# Patient Record
Sex: Female | Born: 1965 | Race: White | Hispanic: No | Smoking: Current every day smoker
Health system: Southern US, Community
[De-identification: ages and names within clinical notes are randomized; demographics above are authoritative.]

## PROBLEM LIST (undated history)

## (undated) DIAGNOSIS — J449 Chronic obstructive pulmonary disease, unspecified: Secondary | ICD-10-CM

## (undated) DIAGNOSIS — F419 Anxiety disorder, unspecified: Secondary | ICD-10-CM

## (undated) DIAGNOSIS — F17200 Nicotine dependence, unspecified, uncomplicated: Secondary | ICD-10-CM

## (undated) DIAGNOSIS — F32A Depression, unspecified: Secondary | ICD-10-CM

## (undated) DIAGNOSIS — J45909 Unspecified asthma, uncomplicated: Secondary | ICD-10-CM

## (undated) HISTORY — PX: ABLATION: SHX5711

## (undated) HISTORY — PX: TIBIA FRACTURE SURGERY: SHX806

---

## 2020-02-20 ENCOUNTER — Emergency Department (INDEPENDENT_AMBULATORY_CARE_PROVIDER_SITE_OTHER)
Admission: EM | Admit: 2020-02-20 | Discharge: 2020-02-20 | Disposition: A | Discharge status: Home / Self Care | Payer: BC Managed Care – PPO | Source: Home / Self Care

## 2020-02-20 ENCOUNTER — Emergency Department (INDEPENDENT_AMBULATORY_CARE_PROVIDER_SITE_OTHER): Payer: BC Managed Care – PPO

## 2020-02-20 ENCOUNTER — Other Ambulatory Visit: Payer: Self-pay

## 2020-02-20 DIAGNOSIS — S42201A Unspecified fracture of upper end of right humerus, initial encounter for closed fracture: Secondary | ICD-10-CM | POA: Diagnosis not present

## 2020-02-20 DIAGNOSIS — W010XXA Fall on same level from slipping, tripping and stumbling without subsequent striking against object, initial encounter: Secondary | ICD-10-CM

## 2020-02-20 DIAGNOSIS — S40021A Contusion of right upper arm, initial encounter: Secondary | ICD-10-CM

## 2020-02-20 DIAGNOSIS — M7989 Other specified soft tissue disorders: Secondary | ICD-10-CM | POA: Diagnosis not present

## 2020-02-20 DIAGNOSIS — W1830XA Fall on same level, unspecified, initial encounter: Secondary | ICD-10-CM

## 2020-02-20 DIAGNOSIS — M79621 Pain in right upper arm: Secondary | ICD-10-CM

## 2020-02-20 MED ORDER — IBUPROFEN 800 MG PO TABS: 800.0000 mg | ORAL_TABLET | Freq: Three times a day (TID) | ORAL | 0 refills | Status: DC | PRN

## 2020-02-20 MED ORDER — FAMOTIDINE 20 MG PO TABS: 20.0000 mg | ORAL_TABLET | Freq: Two times a day (BID) | ORAL | 0 refills | Status: DC

## 2020-02-20 MED ORDER — HYDROCODONE-ACETAMINOPHEN 5-325 MG PO TABS
ORAL_TABLET | ORAL | 0 refills | Status: DC
Start: 2020-02-20 — End: 2020-02-26

## 2020-02-20 NOTE — ED Provider Notes (Addendum)
[name]    CSN: [id number] Arrival date & time: 02/20/20  8453      History   Chief Complaint Chief Complaint  Patient presents with  . Arm Pain    RT    HPI Janet Harper is a 54 y.o. female.   HPI Janet Harper is a 54 y.o. female presenting to UC with c/o sudden onset Right upper arm pain that started yesterday at [facility], pt tripped on a towel in her bathroom, caught her Right upper arm on the counter top.  She has tried rest, ice, and ibuprofen but pain and bruising has only worsened. Pain kept her up last night.  Pain is 3/10 at rest, severe with movement.  Last dose of ibuprofen 800mg  at 7AM this morning. She is Right hand dominant.    History reviewed. No pertinent past medical history.  There are no problems to display for this patient.   History reviewed. No pertinent surgical history.  OB History   No obstetric history on file.      Home Medications    Prior to Admission medications   Medication Sig Start Date End Date Taking? Authorizing Provider  lamoTRIgine (LAMICTAL) 100 MG tablet TAKE 1 TABLET BY MOUTH QD(IF OUT OF LAMICTAL MORE THAN 1 WEEK DO NOT RESTART, CALL MD) 02/07/16  Yes [provider]  cetirizine-pseudoephedrine (ZYRTEC-D) 5-120 MG tablet Take 1 tablet by mouth daily.    [provider]  famotidine (PEPCID) 20 MG tablet Take 1 tablet (20 mg total) by mouth 2 (two) times daily. 02/20/20   02/22/20, PA-C  HYDROcodone-acetaminophen (NORCO/VICODIN) 5-325 MG tablet Take 1 tab every 4-6 hours for moderate to severe pain 02/20/20   02/22/20, PA-C  ibuprofen (ADVIL) 800 MG tablet Take 1 tablet (800 mg total) by mouth 3 (three) times daily as needed for moderate pain. 02/20/20   02/22/20, PA-C  sertraline (ZOLOFT) 50 MG tablet Take 50 mg by mouth daily. 01/09/20   [provider]    Family History History reviewed. No pertinent family history.  Social History Social History   Tobacco Use  .  Smoking status: Current Every Day Smoker    Packs/day: 0.50  . Smokeless tobacco: Never Used  Vaping Use  . Vaping Use: Never used  Substance Use Topics  . Alcohol use: Yes    Comment: occ  . Drug use: Not on file     Allergies   Prednisone   Review of Systems Review of Systems  Musculoskeletal: Positive for myalgias. Negative for arthralgias and joint swelling.  Skin: Positive for color change. Negative for wound.  Neurological: Positive for numbness.     Physical Exam Triage Vital Signs ED Triage Vitals  Enc Vitals Group     BP 02/20/20 1009 (!) 170/90     Pulse Rate 02/20/20 1009 (!) 115     Resp 02/20/20 1009 18     Temp 02/20/20 1009 97.9 F (36.6 C)     Temp Source 02/20/20 1009 Oral     SpO2 02/20/20 1009 98 %     Weight --      Height --      Head Circumference --      Peak Flow --      Pain Score 02/20/20 1012 3     Pain Loc --      Pain Edu? --      Excl. in GC? --    No data found.  Updated  Vital Signs BP (!) 170/90 (BP Location: Left Arm)   Pulse (!) 115   Temp 97.9 F (36.6 C) (Oral)   Resp 18   SpO2 98%   Visual Acuity Right Eye Distance:   Left Eye Distance:   Bilateral Distance:    Right Eye Near:   Left Eye Near:    Bilateral Near:     Physical Exam Vitals and nursing note reviewed.  Constitutional:      Appearance: Normal appearance. She is well-developed.  HENT:     Head: Normocephalic and atraumatic.  Cardiovascular:     Rate and Rhythm: Regular rhythm. Tachycardia present.     Pulses:          Radial pulses are 2+ on the right side.  Pulmonary:     Effort: Pulmonary effort is normal.  Musculoskeletal:        General: Tenderness present.     Right upper arm: Tenderness present.       Arms:     Cervical back: Normal range of motion.     Comments: Right shoulder: limited ROM due to severe pain. No obvious deformity. Right elbow: non-tender. Full ROM, pain radiating into upper arm. Right wrist and hand: full ROM, 5/5  grip strength  Skin:    General: Skin is warm and dry.     Capillary Refill: Capillary refill takes less than 2 seconds.  Neurological:     Mental Status: She is alert and oriented to person, place, and time.     Sensory: No sensory deficit.  Psychiatric:        Behavior: Behavior normal.      UC Treatments / Results  Labs (all labs ordered are listed, but only abnormal results are displayed) Labs Reviewed - No data to display  EKG   Radiology DG Humerus Right  Result Date: 02/20/2020 CLINICAL DATA:  Right upper arm pain with swelling and bruising. Fall yesterday EXAM: RIGHT HUMERUS - 2+ VIEW COMPARISON:  None. FINDINGS: Surgical neck humerus fracture which is transverse and mildly impacted. The greater tuberosity is involved without detectable displacement. Osteopenic appearance. Soft tissue swelling. IMPRESSION: Proximal humerus fracture involving the neck and greater tuberosity with mild neck impaction. Electronically Signed   By: Marnee Spring M.D.   On: 02/20/2020 10:53    Procedures Procedures (including critical care time)  Medications Ordered in UC Medications - No data to display  Initial Impression / Assessment and Plan / UC Course  I have reviewed the triage vital signs and the nursing notes.  Pertinent labs & imaging results that were available during my care of the patient were reviewed by me and considered in my medical decision making (see chart for details).     Discussed imaging with pt Reviewed imaging with Dr. Benjamin Stain Will place pt in cuff-collar. Discussed pain medication, pt hesitant to take vicodin, has caused constipation and nausea in the past. Agreeable to have a small amount sent in for severe pain along with 800mg  ibuprofen (pt has already been taking OTC at home) Discussed risks of GI bleed, will start on pepcid   F/u Sports Medicine next week for ongoing care of fracture AVS given  Final Clinical Impressions(s) / UC Diagnoses    Final diagnoses:  Closed fracture of proximal end of right humerus, initial encounter  Contusion of right upper arm, initial encounter  Fall from slip, trip, or stumble, initial encounter     Discharge Instructions      Norco/Vicodin (hydrocodone-acetaminophen) is a  narcotic pain medication, do not combine these medications with others containing tylenol. While taking, do not drink alcohol, drive, or perform any other activities that requires focus while taking these medications.   Be sure to stay hydrated while taking the vicodin to help prevent constipation. Take with food. You can also take miralax to help prevent constipation.  There is an increased risk of stomach bleeds with taking your sertriline and ibuprofen and with taking higher doses of ibuprofen, sure to take the ibuprofen with food and take the prescribed pepcid to help prevent stomach ulcers or bleeds.    Call today or tomorrow to schedule follow up with Sports Medicine  Dr. Benjamin Stain (Dr. Karie Schwalbe) next week for ongoing management of your fracture.      ED Prescriptions    Medication Sig Dispense Auth. Provider   HYDROcodone-acetaminophen (NORCO/VICODIN) 5-325 MG tablet Take 1 tab every 4-6 hours for moderate to severe pain 8 tablet Doroteo Glassman, Grizelda Piscopo O, PA-C   ibuprofen (ADVIL) 800 MG tablet Take 1 tablet (800 mg total) by mouth 3 (three) times daily as needed for moderate pain. 21 tablet Doroteo Glassman, Audrey Thull O, PA-C   famotidine (PEPCID) 20 MG tablet  (Status: Discontinued) Take 1 tablet (20 mg total) by mouth 2 (two) times daily. 14 tablet Doroteo Glassman, Kaisyn Reinhold O, PA-C   famotidine (PEPCID) 20 MG tablet Take 1 tablet (20 mg total) by mouth 2 (two) times daily. 14 tablet Lurene Shadow, [place]     I have reviewed the PDMP during this encounter.   Lurene Shadow, PA-C 02/20/20 1307    Lurene Shadow, PA-C 02/20/20 1308

## 2020-02-20 NOTE — ED Triage Notes (Signed)
Pt c/o RT arm pain since 5am yesterday when pt fell in bathroom after tripping over a towel and hitting her arm on bathroom cabinet. Bruising noted. Ice and ibuprofen prn. Last dose 7am, 800mg  ibuprofen. Pain 3/10 if not moving it.

## 2020-02-20 NOTE — Discharge Instructions (Signed)
  Norco/Vicodin (hydrocodone-acetaminophen) is a narcotic pain medication, do not combine these medications with others containing tylenol. While taking, do not drink alcohol, drive, or perform any other activities that requires focus while taking these medications.   Be sure to stay hydrated while taking the vicodin to help prevent constipation. Take with food. You can also take miralax to help prevent constipation.  There is an increased risk of stomach bleeds with taking your sertriline and ibuprofen and with taking higher doses of ibuprofen, sure to take the ibuprofen with food and take the prescribed pepcid to help prevent stomach ulcers or bleeds.    Call today or tomorrow to schedule follow up with Sports Medicine  Dr. Benjamin Stain (Dr. Karie Schwalbe) next week for ongoing management of your fracture.

## 2020-02-26 ENCOUNTER — Encounter: Payer: Self-pay | Admitting: Sports Medicine

## 2020-02-26 ENCOUNTER — Other Ambulatory Visit: Payer: Self-pay

## 2020-02-26 ENCOUNTER — Ambulatory Visit (INDEPENDENT_AMBULATORY_CARE_PROVIDER_SITE_OTHER): Payer: BC Managed Care – PPO | Admitting: Sports Medicine

## 2020-02-26 ENCOUNTER — Ambulatory Visit (INDEPENDENT_AMBULATORY_CARE_PROVIDER_SITE_OTHER): Payer: BC Managed Care – PPO

## 2020-02-26 DIAGNOSIS — S42294A Other nondisplaced fracture of upper end of right humerus, initial encounter for closed fracture: Secondary | ICD-10-CM

## 2020-02-26 DIAGNOSIS — S42201A Unspecified fracture of upper end of right humerus, initial encounter for closed fracture: Secondary | ICD-10-CM | POA: Insufficient documentation

## 2020-02-26 MED ORDER — CALCIUM CARBONATE-VITAMIN D 600-400 MG-UNIT PO TABS: 1.0000 | ORAL_TABLET | Freq: Two times a day (BID) | ORAL | 11 refills | Status: AC

## 2020-02-26 MED ORDER — MELOXICAM 15 MG PO TABS: ORAL_TABLET | ORAL | 3 refills | Status: DC

## 2020-02-26 MED ORDER — TRAMADOL HCL 50 MG PO TABS: 50.0000 mg | ORAL_TABLET | Freq: Three times a day (TID) | ORAL | 0 refills | Status: DC | PRN

## 2020-02-26 MED ORDER — DOCUSATE SODIUM 100 MG PO CAPS: 100.0000 mg | ORAL_CAPSULE | Freq: Three times a day (TID) | ORAL | 3 refills | Status: DC | PRN

## 2020-02-26 NOTE — Progress Notes (Addendum)
    Procedures performed today:    None.  Independent interpretation of notes and tests performed by another provider:   I personally reviewed the x-rays which show an impacted type proximal humeral fracture without angulation, articular surface appears uninvolved.  Brief History, Exam, Impression, and Recommendations:    Fracture of humerus, proximal, right, closed This is a very pleasant 54 year old female, unfortunately she had a fall, seen in urgent care where x-rays showed an impacted fracture of her proximal humerus, no angulation initially. She was appropriately placed in a cuff and collar, unfortunately hydrocodone caused excessive constipation so we will be switching to meloxicam with tramadol for breakthrough pain. She will do a high-fiber diet and some Colace to keep ahead of the constipation.  Calcium and vitamin D supplementation. Repeating x-rays today, return to see me in a month.   X-rays before the next visit as well.  Jona is a smoker and she understands that that will probably delay her healing to at least 3 months, she will try to cut back on smoking and discussed this with her PCP, she can also try Nicorette gum and patches.    ___________________________________________ Ihor Austin. Benjamin Stain, M.D., ABFM., CAQSM. Primary Care and Sports Medicine [facility]  Adjunct Instructor of Family Medicine  [employer]

## 2020-02-26 NOTE — Addendum Note (Signed)
Addended by: Monica Becton on: 02/26/2020 10:46 AM   Modules accepted: Orders

## 2020-02-26 NOTE — Assessment & Plan Note (Addendum)
This is a very pleasant 54 year old female, unfortunately she had a fall, seen in urgent care where x-rays showed an impacted fracture of her proximal humerus, no angulation initially. She was appropriately placed in a cuff and collar, unfortunately hydrocodone caused excessive constipation so we will be switching to meloxicam with tramadol for breakthrough pain. She will do a high-fiber diet and some Colace to keep ahead of the constipation.  Calcium and vitamin D supplementation. Repeating x-rays today, return to see me in a month.   X-rays before the next visit as well.  Janet Harper is a smoker and she understands that that will probably delay her healing to at least 3 months, she will try to cut back on smoking and discussed this with her PCP, she can also try Nicorette gum and patches.

## 2020-03-25 ENCOUNTER — Ambulatory Visit (INDEPENDENT_AMBULATORY_CARE_PROVIDER_SITE_OTHER): Payer: BC Managed Care – PPO

## 2020-03-25 ENCOUNTER — Other Ambulatory Visit: Payer: Self-pay

## 2020-03-25 ENCOUNTER — Ambulatory Visit (INDEPENDENT_AMBULATORY_CARE_PROVIDER_SITE_OTHER): Payer: BC Managed Care – PPO | Admitting: Sports Medicine

## 2020-03-25 DIAGNOSIS — S42294A Other nondisplaced fracture of upper end of right humerus, initial encounter for closed fracture: Secondary | ICD-10-CM | POA: Diagnosis not present

## 2020-03-25 DIAGNOSIS — S42294D Other nondisplaced fracture of upper end of right humerus, subsequent encounter for fracture with routine healing: Secondary | ICD-10-CM

## 2020-03-25 DIAGNOSIS — W01198A Fall on same level from slipping, tripping and stumbling with subsequent striking against other object, initial encounter: Secondary | ICD-10-CM

## 2020-03-25 NOTE — Progress Notes (Signed)
    Procedures performed today:    None.  Independent interpretation of notes and tests performed by another provider:   X-rays personally reviewed and show good alignment, excellent bony callus.  Brief History, Exam, Impression, and Recommendations:    Fracture of humerus, proximal, right, closed We are now approximately 1 month post proximal humeral fracture on the right, Janet Harper has been in a cuff and collar, taking meloxicam, occasional tramadol. Very little tenderness over the fracture today. She is on calcium and vitamin D, constipation has improved. X-rays look good today, she does understand that this can take 2 to 3 months to heal with her smoking. I like to see her back in a month at which point we will probably transition her into PT. X-ray before next visit.    ___________________________________________ Ihor Austin. Benjamin Stain, M.D., ABFM., CAQSM. Primary Care and Sports Medicine [facility]  Adjunct Instructor of Family Medicine  [employer]

## 2020-03-25 NOTE — Assessment & Plan Note (Addendum)
We are now approximately 1 month post proximal humeral fracture on the right, Annabelle has been in a cuff and collar, taking meloxicam, occasional tramadol. Very little tenderness over the fracture today. She is on calcium and vitamin D, constipation has improved. X-rays look good today, she does understand that this can take 2 to 3 months to heal with her smoking. I like to see her back in a month at which point we will probably transition her into PT. X-ray before next visit.

## 2020-03-27 ENCOUNTER — Other Ambulatory Visit: Payer: Self-pay | Admitting: *Deleted

## 2020-03-27 DIAGNOSIS — S42294A Other nondisplaced fracture of upper end of right humerus, initial encounter for closed fracture: Secondary | ICD-10-CM

## 2020-03-27 MED ORDER — TRAMADOL HCL 50 MG PO TABS: 50.0000 mg | ORAL_TABLET | Freq: Three times a day (TID) | ORAL | 0 refills | Status: DC | PRN

## 2020-04-23 ENCOUNTER — Other Ambulatory Visit: Payer: Self-pay

## 2020-04-23 ENCOUNTER — Ambulatory Visit (INDEPENDENT_AMBULATORY_CARE_PROVIDER_SITE_OTHER): Payer: BC Managed Care – PPO

## 2020-04-23 ENCOUNTER — Ambulatory Visit (INDEPENDENT_AMBULATORY_CARE_PROVIDER_SITE_OTHER): Payer: BC Managed Care – PPO | Admitting: Sports Medicine

## 2020-04-23 DIAGNOSIS — S42294A Other nondisplaced fracture of upper end of right humerus, initial encounter for closed fracture: Secondary | ICD-10-CM

## 2020-04-23 DIAGNOSIS — S42294D Other nondisplaced fracture of upper end of right humerus, subsequent encounter for fracture with routine healing: Secondary | ICD-10-CM

## 2020-04-23 MED ORDER — TRAMADOL HCL 50 MG PO TABS: 50.0000 mg | ORAL_TABLET | Freq: Three times a day (TID) | ORAL | 0 refills | Status: DC | PRN

## 2020-04-23 MED ORDER — IBUPROFEN 200 MG PO TABS: 800.0000 mg | ORAL_TABLET | Freq: Three times a day (TID) | ORAL | 0 refills | Status: DC | PRN

## 2020-04-23 NOTE — Assessment & Plan Note (Signed)
Janet Harper returns, she is a very pleasant 54 year old female, she is now about 2 months post a comminuted proximal humeral fracture with minimal angulation, her articular surface was intact. She has done really well in a cuff and collar, fracture has maintained its alignment, x-rays look really good, excellent bony callus formation. Discontinue cuff and collar, and we are going to start physical therapy. I am going to refill her tramadol, and she prefers ibuprofen to meloxicam. She is taking 800 mg 3 times daily of the over-the-counter form.

## 2020-04-23 NOTE — Progress Notes (Signed)
    Procedures performed today:    None.  Independent interpretation of notes and tests performed by another provider:   I did personally review her x-rays, they do show excellent bony callus, articular surface is intact.  Fracture lines of course still visible.  Brief History, Exam, Impression, and Recommendations:    Fracture of humerus, proximal, right, closed Janet Harper returns, she is a very pleasant 54 year old female, she is now about 2 months post a comminuted proximal humeral fracture with minimal angulation, her articular surface was intact. She has done really well in a cuff and collar, fracture has maintained its alignment, x-rays look really good, excellent bony callus formation. Discontinue cuff and collar, and we are going to start physical therapy. I am going to refill her tramadol, and she prefers ibuprofen to meloxicam. She is taking 800 mg 3 times daily of the over-the-counter form.    ___________________________________________ Ihor Austin. Benjamin Stain, M.D., ABFM., CAQSM. Primary Care and Sports Medicine [facility]  Adjunct Instructor of Family Medicine  [employer]

## 2020-05-05 ENCOUNTER — Telehealth: Payer: Self-pay

## 2020-05-05 NOTE — Telephone Encounter (Signed)
She does not, her fracture was already healing well on the last x-ray.

## 2020-05-05 NOTE — Telephone Encounter (Signed)
Patient is scheduled to start PT tomorrow and wants to make sure she doesn't need any additional imaging prior to beginning.  Please advise.

## 2020-05-05 NOTE — Telephone Encounter (Signed)
Patient aware of Dr. T recommendations.  

## 2020-05-06 ENCOUNTER — Other Ambulatory Visit: Payer: Self-pay

## 2020-05-06 ENCOUNTER — Encounter: Payer: Self-pay | Admitting: Rehabilitative and Restorative Service Providers"

## 2020-05-06 ENCOUNTER — Ambulatory Visit (INDEPENDENT_AMBULATORY_CARE_PROVIDER_SITE_OTHER): Payer: BC Managed Care – PPO | Admitting: Rehabilitative and Restorative Service Providers"

## 2020-05-06 DIAGNOSIS — R29898 Other symptoms and signs involving the musculoskeletal system: Secondary | ICD-10-CM

## 2020-05-06 DIAGNOSIS — M25511 Pain in right shoulder: Secondary | ICD-10-CM

## 2020-05-06 DIAGNOSIS — R293 Abnormal posture: Secondary | ICD-10-CM

## 2020-05-06 DIAGNOSIS — M6281 Muscle weakness (generalized): Secondary | ICD-10-CM | POA: Diagnosis not present

## 2020-05-06 DIAGNOSIS — M25611 Stiffness of right shoulder, not elsewhere classified: Secondary | ICD-10-CM
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
## 2020-05-06 NOTE — Patient Instructions (Signed)
Access Code: [email or web address]: [email or web address]: 11/10/2021Prepared by: Raven Furnas HoltExercises  Circular Shoulder Pendulum with Table Support - 3-4 x daily - 7 x weekly - 1 sets - 20-30 reps  Seated Cervical Retraction - 2 x daily - 7 x weekly - 1-2 sets - 5-10 reps - 10 sec hold  Seated Scapular Retraction - 2 x daily - 7 x weekly - 1-2 sets - 10 reps - 10 sec hold  Standing Shoulder and Trunk Flexion at Table - 3 x daily - 7 x weekly - 1 sets - 3-5 reps - 10-30 sec hold  Elbow Extension PROM - 2 x daily - 7 x weekly - 1 sets - 3-5 reps - 10-20 sec hold Patient Education  TENS Unit

## 2020-05-13 ENCOUNTER — Ambulatory Visit (INDEPENDENT_AMBULATORY_CARE_PROVIDER_SITE_OTHER): Payer: BC Managed Care – PPO | Admitting: Rehabilitative and Restorative Service Providers"

## 2020-05-13 ENCOUNTER — Encounter: Payer: Self-pay | Admitting: Rehabilitative and Restorative Service Providers"

## 2020-05-13 ENCOUNTER — Other Ambulatory Visit: Payer: Self-pay

## 2020-05-13 DIAGNOSIS — M6281 Muscle weakness (generalized): Secondary | ICD-10-CM

## 2020-05-13 DIAGNOSIS — R29898 Other symptoms and signs involving the musculoskeletal system: Secondary | ICD-10-CM | POA: Diagnosis not present

## 2020-05-13 DIAGNOSIS — M25611 Stiffness of right shoulder, not elsewhere classified: Secondary | ICD-10-CM

## 2020-05-13 DIAGNOSIS — R293 Abnormal posture: Secondary | ICD-10-CM

## 2020-05-13 DIAGNOSIS — M25511 Pain in right shoulder: Secondary | ICD-10-CM | POA: Diagnosis not present
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
## 2020-05-13 NOTE — Patient Instructions (Signed)
Access Code: [email or web address]: [email or web address]: 11/17/2021Prepared by: Laythan Hayter HoltExercises  Circular Shoulder Pendulum with Table Support - 3-4 x daily - 7 x weekly - 1 sets - 20-30 reps  Seated Cervical Retraction - 2 x daily - 7 x weekly - 1-2 sets - 5-10 reps - 10 sec hold  Seated Scapular Retraction - 2 x daily - 7 x weekly - 1-2 sets - 10 reps - 10 sec hold  Standing Shoulder and Trunk Flexion at Table - 3 x daily - 7 x weekly - 1 sets - 3-5 reps - 10-30 sec hold  Elbow Extension PROM - 2 x daily - 7 x weekly - 1 sets - 3-5 reps - 10-20 sec hold  Seated Shoulder Flexion AAROM with Pulley Behind - 2 x daily - 7 x weekly - 1 sets - 10 reps - 10 sec hold  Seated Shoulder Scaption AAROM with Pulley at Side - 2 x daily - 7 x weekly - 1 sets - 10 reps - 10sec hold  Seated External Rotation in Abduction AAROM with Pulley - 2 x daily - 7 x weekly - 1 sets - 5-10 reps - 10 sec hold

## 2020-05-20 ENCOUNTER — Encounter: Payer: BC Managed Care – PPO | Admitting: Rehabilitative and Restorative Service Providers"

## 2020-05-28 ENCOUNTER — Other Ambulatory Visit: Payer: Self-pay

## 2020-05-28 ENCOUNTER — Ambulatory Visit (INDEPENDENT_AMBULATORY_CARE_PROVIDER_SITE_OTHER): Payer: BC Managed Care – PPO | Admitting: Physical Therapy

## 2020-05-28 ENCOUNTER — Encounter: Payer: Self-pay | Admitting: Physical Therapy

## 2020-05-28 DIAGNOSIS — R29898 Other symptoms and signs involving the musculoskeletal system: Secondary | ICD-10-CM | POA: Diagnosis not present

## 2020-05-28 DIAGNOSIS — M6281 Muscle weakness (generalized): Secondary | ICD-10-CM | POA: Diagnosis not present

## 2020-05-28 DIAGNOSIS — M25611 Stiffness of right shoulder, not elsewhere classified: Secondary | ICD-10-CM | POA: Diagnosis not present

## 2020-05-28 DIAGNOSIS — M25511 Pain in right shoulder: Secondary | ICD-10-CM

## 2020-05-28 DIAGNOSIS — R293 Abnormal posture: Secondary | ICD-10-CM
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
## 2020-06-04 ENCOUNTER — Ambulatory Visit: Payer: BC Managed Care – PPO | Admitting: Sports Medicine

## 2020-06-05 ENCOUNTER — Encounter: Payer: Self-pay | Admitting: Physical Therapy

## 2020-06-05 ENCOUNTER — Ambulatory Visit (INDEPENDENT_AMBULATORY_CARE_PROVIDER_SITE_OTHER): Payer: BC Managed Care – PPO | Admitting: Physical Therapy

## 2020-06-05 ENCOUNTER — Other Ambulatory Visit: Payer: Self-pay

## 2020-06-05 DIAGNOSIS — R29898 Other symptoms and signs involving the musculoskeletal system: Secondary | ICD-10-CM | POA: Diagnosis not present

## 2020-06-05 DIAGNOSIS — M25611 Stiffness of right shoulder, not elsewhere classified: Secondary | ICD-10-CM

## 2020-06-05 DIAGNOSIS — M6281 Muscle weakness (generalized): Secondary | ICD-10-CM

## 2020-06-05 DIAGNOSIS — M25511 Pain in right shoulder: Secondary | ICD-10-CM | POA: Diagnosis not present

## 2020-06-05 DIAGNOSIS — R293 Abnormal posture: Secondary | ICD-10-CM
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
## 2020-06-10 ENCOUNTER — Ambulatory Visit (INDEPENDENT_AMBULATORY_CARE_PROVIDER_SITE_OTHER): Payer: BC Managed Care – PPO | Admitting: Physical Therapy

## 2020-06-10 ENCOUNTER — Other Ambulatory Visit: Payer: Self-pay

## 2020-06-10 ENCOUNTER — Encounter: Payer: Self-pay | Admitting: Physical Therapy

## 2020-06-10 DIAGNOSIS — R29898 Other symptoms and signs involving the musculoskeletal system: Secondary | ICD-10-CM

## 2020-06-10 DIAGNOSIS — R293 Abnormal posture: Secondary | ICD-10-CM

## 2020-06-10 DIAGNOSIS — M6281 Muscle weakness (generalized): Secondary | ICD-10-CM | POA: Diagnosis not present

## 2020-06-10 DIAGNOSIS — M25611 Stiffness of right shoulder, not elsewhere classified: Secondary | ICD-10-CM

## 2020-06-10 DIAGNOSIS — M25511 Pain in right shoulder: Secondary | ICD-10-CM | POA: Diagnosis not present

## 2020-06-10 NOTE — Patient Instructions (Signed)
   AAROM Shoulder Flexion - Supine  Lie on your back and holding wand/cane with both arms as shown, raise it up allowing your unaffected arm to push up your affected arm. Return to beginning position and repeat 10 times with 5 second holds, 1-2 times per day. Only work through available range of motion.    SUPINE SHOULDER ABDUCTION - WAND - AAROM  Lie on our back and hold a wand/cane palm face up on the injured side and palm face down on the uninjured side. Use the unaffected arm to slowly raise up your injured arm to the side. Hold this position and then lower arm back down by your side and repeat 10 times with 5 second holds, 1-2 times per day.      SIDE LYING INTERNAL ROTATION STRETCH - IR SLEEPER STRETCH  Start by lying on your side with the affected arm on the bottom. Your affected arm should be bent at the elbow and forearm pointed upwards towards the ceiling as shown. Next, use your unaffected arm to gently draw your affected forearm towards the table or bed for an inward stretch.   Hold, relax and repeat for 30 seconds. Repeat 2-3 times, 1-2 times per day.    shoulder low external rotation stretch     Stand at door frame or wall corner. Place one foot through the threshold. Place palm of involved hand on the wall/door as shown. Gently lean through the door using the forward foot to control motion, until a gentle stretch is felt in the chest and front of shoulder. Hold for prescribed time. then return to start/rest.   Hold for 30 seconds, repeat 2-3 times 1-2 times per day.      SCAPULAR RETRACTIONS  Move your shoulder blades back and down. Hold for 5 seconds, relax, and repeat 10-15 times twice a day.      Supine AROM Shoulder Abduction  Lay on your back with hands at your sides. Slide your affected arm up while keeping it table-level. return and repeat 10 times, twice a day.   Wall pushup  Stand adjacent to a wall.  Lean against the wall, with arms extended,  so that your body is at a slight angle.  Bend arms and go to a depth as tolerated.  Repeat 10 times, twice a day.
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
## 2020-06-12 ENCOUNTER — Ambulatory Visit (INDEPENDENT_AMBULATORY_CARE_PROVIDER_SITE_OTHER): Payer: BC Managed Care – PPO | Admitting: Sports Medicine

## 2020-06-12 ENCOUNTER — Ambulatory Visit: Payer: Self-pay

## 2020-06-12 ENCOUNTER — Ambulatory Visit (INDEPENDENT_AMBULATORY_CARE_PROVIDER_SITE_OTHER): Payer: BC Managed Care – PPO

## 2020-06-12 ENCOUNTER — Other Ambulatory Visit: Payer: Self-pay

## 2020-06-12 DIAGNOSIS — S42294D Other nondisplaced fracture of upper end of right humerus, subsequent encounter for fracture with routine healing: Secondary | ICD-10-CM

## 2020-06-12 NOTE — Progress Notes (Signed)
    Procedures performed today:    Procedure: Real-time Ultrasound Guided injection of the right glenohumeral joint Device: Samsung HS60  Verbal informed consent obtained.  Time-out conducted.  Noted no overlying erythema, induration, or other signs of local infection.  Skin prepped in a sterile fashion.  Local anesthesia: Topical Ethyl chloride.  With sterile technique and under real time ultrasound guidance: 1 cc Kenalog 40, 2 cc lidocaine, 2 cc bupivacaine injected easily Completed without difficulty  Advised to call if fevers/chills, erythema, induration, drainage, or persistent bleeding.  Images permanently stored and available for review in PACS.  Impression: Technically successful ultrasound guided injection.  Independent interpretation of notes and tests performed by another provider:   None.  Brief History, Exam, Impression, and Recommendations:    Fracture of humerus, proximal, right, closed Janet Harper returns, she is a very pleasant 54 year old female, she is now 3-1/2 months post comminuted proximal humeral fracture with minimal angulation, intact articular surface. Has done really well initially in a cuff and collar, and now with formal physical therapy with significant loss of external rotation and internal rotation. At this point we are going to do a glenohumeral injection, and she will continue physical therapy with Janet Harper to regain the range of motion. After 4 weeks if she has not regained significant range of motion we will do a CT scan to evaluate any bony obstruction to motion.     ___________________________________________ Janet Harper. Benjamin Stain, M.D., ABFM., CAQSM. Primary Care and Sports Medicine [facility]  Adjunct Instructor of Family Medicine  [employer]

## 2020-06-12 NOTE — Assessment & Plan Note (Signed)
Janet Harper returns, she is a very pleasant 54 year old female, she is now 3-1/2 months post comminuted proximal humeral fracture with minimal angulation, intact articular surface. Has done really well initially in a cuff and collar, and now with formal physical therapy with significant loss of external rotation and internal rotation. At this point we are going to do a glenohumeral injection, and she will continue physical therapy with Wynona Canes to regain the range of motion. After 4 weeks if she has not regained significant range of motion we will do a CT scan to evaluate any bony obstruction to motion.

## 2020-07-13 ENCOUNTER — Ambulatory Visit: Payer: BC Managed Care – PPO | Admitting: Sports Medicine

## 2020-07-29 ENCOUNTER — Other Ambulatory Visit: Payer: Self-pay

## 2020-07-29 ENCOUNTER — Ambulatory Visit (INDEPENDENT_AMBULATORY_CARE_PROVIDER_SITE_OTHER): Payer: BC Managed Care – PPO | Admitting: Sports Medicine

## 2020-07-29 DIAGNOSIS — S42201S Unspecified fracture of upper end of right humerus, sequela: Secondary | ICD-10-CM | POA: Diagnosis not present

## 2020-07-29 NOTE — Progress Notes (Signed)
    Procedures performed today:    None.  Independent interpretation of notes and tests performed by another provider:   None.  Brief History, Exam, Impression, and Recommendations:    Fracture of humerus, proximal, right, closed Lorriann returns, she is a very pleasant 55 year old female, now 4-1/2 months post comminuted proximal humeral fracture with minimal angulation, intact articular surface, she initially did well in a cuff and collar, she has had formal physical therapy but still has significant loss of external rotation. At the last visit we did a glenohumeral injection, she has improved considerably, she has no pain, she has a functional shoulder, unfortunately she still has significant loss of motion, approximately 20 degrees of flexion lag, and approximately 40 degrees of external rotation lag. Because she does feel as though she can live that we are going to leave things alone, if she changes her mind we will consider getting a CT and referral for manipulation under anesthesia with Dr. Everardo Pacific. Otherwise return to see me as needed.    ___________________________________________ Ihor Austin. Benjamin Stain, M.D., ABFM., CAQSM. Primary Care and Sports Medicine [facility]  Adjunct Instructor of Family Medicine  [employer]

## 2020-07-29 NOTE — Assessment & Plan Note (Signed)
Janet Harper returns, she is a very pleasant 55 year old female, now 4-1/2 months post comminuted proximal humeral fracture with minimal angulation, intact articular surface, she initially did well in a cuff and collar, she has had formal physical therapy but still has significant loss of external rotation. At the last visit we did a glenohumeral injection, she has improved considerably, she has no pain, she has a functional shoulder, unfortunately she still has significant loss of motion, approximately 20 degrees of flexion lag, and approximately 40 degrees of external rotation lag. Because she does feel as though she can live that we are going to leave things alone, if she changes her mind we will consider getting a CT and referral for manipulation under anesthesia with Dr. Everardo Pacific. Otherwise return to see me as needed.

## 2021-03-21 IMAGING — DX DG HUMERUS 2V *R*
2 series · 2 of 2 positions shown · non-contrast
Comparison: March 25, 2020

CLINICAL DATA: Re-evaluation of proximal humeral fracture. No
re-injury.

EXAM:
RIGHT HUMERUS - 2+ VIEW

[humerus ap]
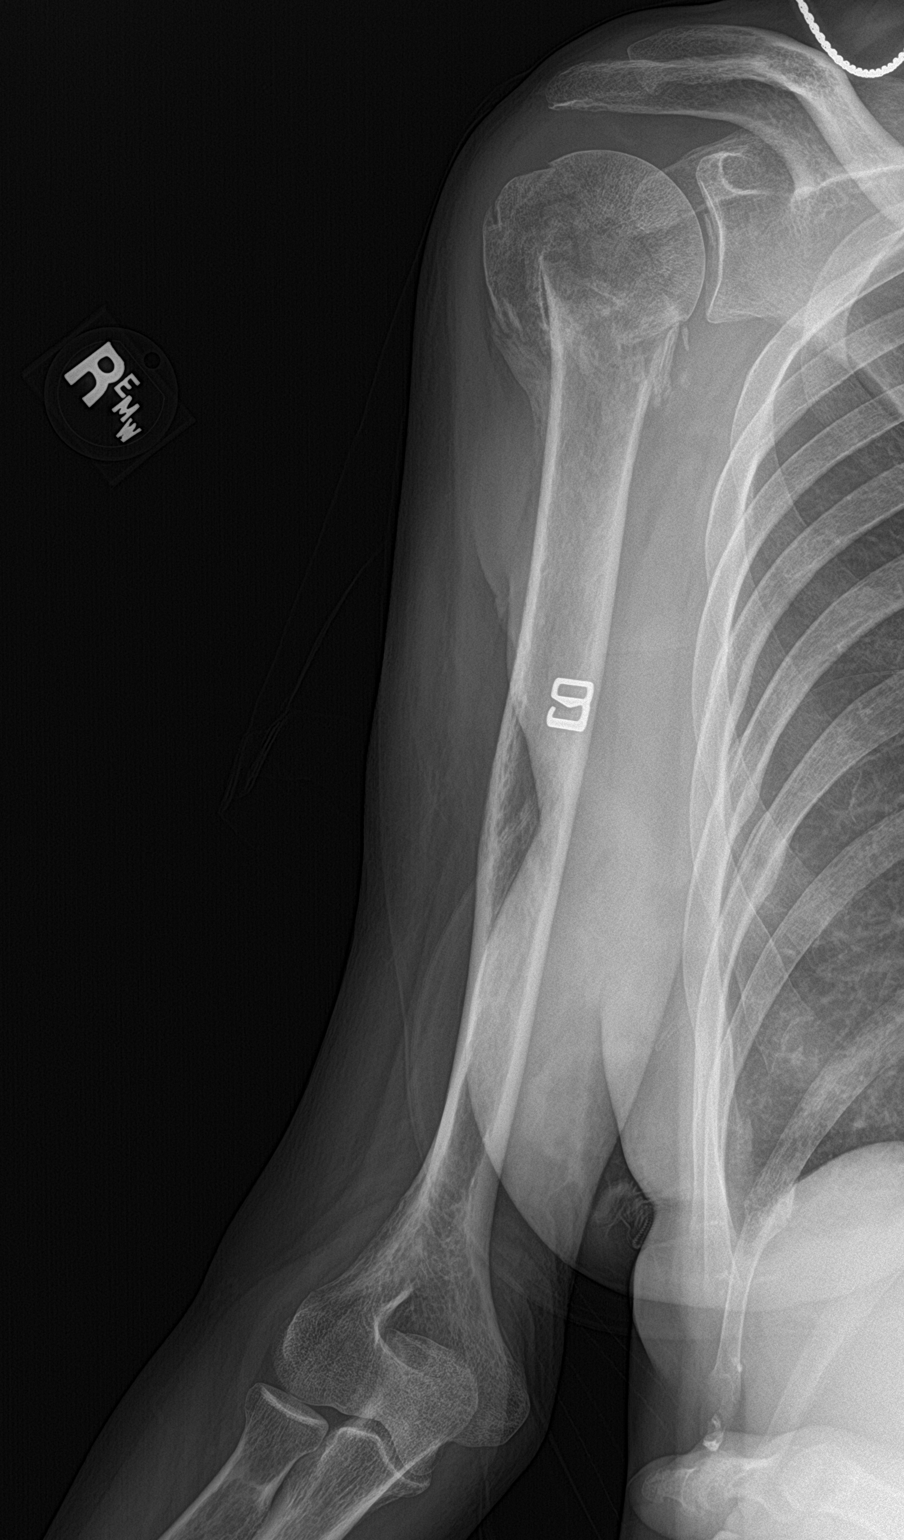

[humerus lat]
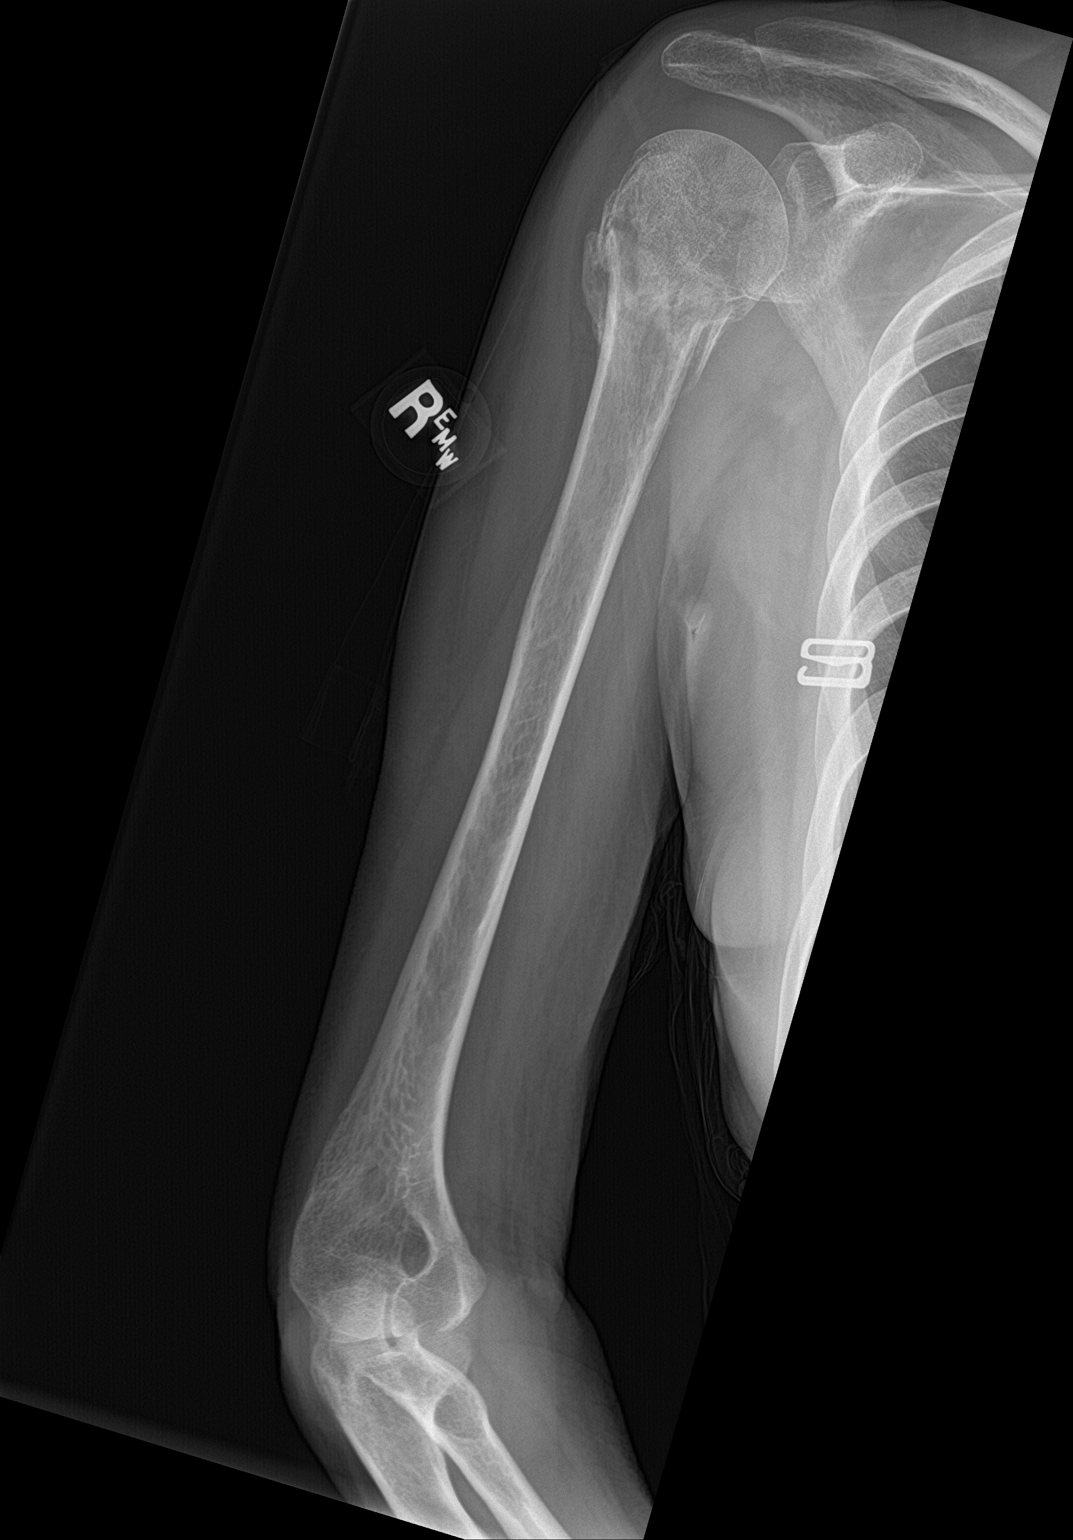

[2 of 2 positions shown; findings below may reference images not displayed]

FINDINGS: The comminuted fracture of the proximal right humerus is again noted
with persistent mild callus formation, not significantly changed in
the interval. Irregularity along the glenoid is likely a small
fracture fragment which could arise from the humerus or the glenoid
itself. This was not seen previously but the difference is likely
positional in nature.
IMPRESSION: 1. Comminuted fracture of the proximal right humerus with persistent
mild callus formation.
2. Irregularity along the glenoid is likely a small fracture
fragment which could arise from the humerus or the glenoid itself.
This was not seen previously, likely due to difference in
positioning today.

## 2021-05-05 ENCOUNTER — Other Ambulatory Visit: Payer: Self-pay

## 2021-05-05 ENCOUNTER — Emergency Department (INDEPENDENT_AMBULATORY_CARE_PROVIDER_SITE_OTHER)
Admission: EM | Admit: 2021-05-05 | Discharge: 2021-05-05 | Disposition: A | Discharge status: Home / Self Care | Payer: BC Managed Care – PPO | Source: Home / Self Care

## 2021-05-05 ENCOUNTER — Emergency Department (INDEPENDENT_AMBULATORY_CARE_PROVIDER_SITE_OTHER): Payer: BC Managed Care – PPO

## 2021-05-05 DIAGNOSIS — M25512 Pain in left shoulder: Secondary | ICD-10-CM | POA: Diagnosis not present

## 2021-05-05 DIAGNOSIS — S42412A Displaced simple supracondylar fracture without intercondylar fracture of left humerus, initial encounter for closed fracture: Secondary | ICD-10-CM

## 2021-05-05 DIAGNOSIS — R0789 Other chest pain: Secondary | ICD-10-CM | POA: Diagnosis not present

## 2021-05-05 DIAGNOSIS — W19XXXA Unspecified fall, initial encounter: Secondary | ICD-10-CM

## 2021-05-05 MED ORDER — OXYCODONE-ACETAMINOPHEN 5-325 MG PO TABS: 1.0000 | ORAL_TABLET | Freq: Three times a day (TID) | ORAL | 0 refills | Status: DC | PRN

## 2021-05-05 NOTE — Discharge Instructions (Addendum)
Advised/instructed patient to wear left shoulder immobilizer 24/7 until following up with orthopedic provider.  Advised patient to use pain medication sparingly. Encouraged patient to follow-up with orthopedic provider today or tomorrow for further evaluation/Fracture management.

## 2021-05-05 NOTE — ED Triage Notes (Signed)
Pt c/o LT arm and shoulder pain since last night when she fell. Pain 10/10 Motrin prn.

## 2021-05-05 NOTE — ED Provider Notes (Signed)
Janet Harper CARE    CSN: [id number] Arrival date & time: 05/05/21  1023      History   Chief Complaint Chief Complaint  Patient presents with   Arm Injury    LT   Shoulder Injury    LT    HPI Janet Harper is a 55 y.o. female.   HPI 55 year old female presents with left shoulder pain, left upper arm pain, and left upper chest pain secondary to fall at her home last night.  Patient reports inadvertently tripping over her dog and falling into a door frame with left shoulder. Reports right should pain as 9/10.  History reviewed. No pertinent past medical history.  Patient Active Problem List   Diagnosis Date Noted   Fracture of humerus, proximal, right, closed 02/26/2020    History reviewed. No pertinent surgical history.  OB History   No obstetric history on file.      Home Medications    Prior to Admission medications   Medication Sig Start Date End Date Taking? Authorizing Provider  oxyCODONE-acetaminophen (PERCOCET/ROXICET) 5-325 MG tablet Take 1-2 tablets by mouth every 8 (eight) hours as needed for severe pain. 05/05/21  Yes Trevor Iha, FNP  Calcium Carbonate-Vitamin D 600-400 MG-UNIT tablet Take 1 tablet by mouth 2 (two) times daily. 02/26/20   Monica Becton, MD  cetirizine-pseudoephedrine (ZYRTEC-D) 5-120 MG tablet Take 1 tablet by mouth daily.    [provider]  docusate sodium (COLACE) 100 MG capsule Take 1 capsule (100 mg total) by mouth 3 (three) times daily as needed. Patient not taking: Reported on 05/06/2020 02/26/20   Monica Becton, MD  famotidine (PEPCID) 20 MG tablet Take 1 tablet (20 mg total) by mouth 2 (two) times daily. Patient not taking: Reported on 05/06/2020 02/20/20   Lurene Shadow, PA-C  ibuprofen (ADVIL) 200 MG tablet Take 4 tablets (800 mg total) by mouth every 8 (eight) hours as needed. 04/23/20   Monica Becton, MD  lamoTRIgine (LAMICTAL) 100 MG tablet TAKE 1 TABLET BY MOUTH QD(IF OUT OF LAMICTAL  MORE THAN 1 WEEK DO NOT RESTART, CALL MD) 02/07/16   [provider]  sertraline (ZOLOFT) 50 MG tablet Take 50 mg by mouth daily. 01/09/20   [provider]  traMADol (ULTRAM) 50 MG tablet Take 1-2 tablets (50-100 mg total) by mouth every 8 (eight) hours as needed for moderate pain. Maximum 6 tabs per day. 04/23/20   Monica Becton, MD    Family History History reviewed. No pertinent family history.  Social History Social History   Tobacco Use   Smoking status: Every Day    Packs/day: 0.50    Types: Cigarettes   Smokeless tobacco: Never  Vaping Use   Vaping Use: Never used  Substance Use Topics   Alcohol use: Yes    Comment: occ     Allergies   Prednisone   Review of Systems Review of Systems  All other systems reviewed and are negative.   Physical Exam Triage Vital Signs ED Triage Vitals  Enc Vitals Group     BP 05/05/21 1100 (!) 147/107     Pulse Rate 05/05/21 1100 (!) 107     Resp 05/05/21 1100 18     Temp 05/05/21 1100 98.2 F (36.8 C)     Temp Source 05/05/21 1100 Oral     SpO2 05/05/21 1100 97 %     Weight --      Height --      Head Circumference --  Peak Flow --      Pain Score 05/05/21 1059 10     Pain Loc --      Pain Edu? --      Excl. in GC? --    No data found.  Updated Vital Signs BP (!) 147/107 (BP Location: Right Arm)   Pulse (!) 107   Temp 98.2 F (36.8 C) (Oral)   Resp 18   SpO2 97%    Physical Exam Vitals and nursing note reviewed.  Constitutional:      General: She is not in acute distress.    Appearance: Normal appearance. She is normal weight. She is ill-appearing.  HENT:     Head: Normocephalic and atraumatic.     Nose: Nose normal.     Mouth/Throat:     Mouth: Mucous membranes are dry.     Pharynx: Oropharynx is clear.  Eyes:     Extraocular Movements: Extraocular movements intact.     Conjunctiva/sclera: Conjunctivae normal.     Pupils: Pupils are equal, round, and reactive to light.   Cardiovascular:     Rate and Rhythm: Normal rate and regular rhythm.     Pulses: Normal pulses.     Heart sounds: Normal heart sounds.  Pulmonary:     Effort: Pulmonary effort is normal.     Breath sounds: Normal breath sounds.  Musculoskeletal:        General: Normal range of motion.     Cervical back: Normal range of motion and neck supple.     Comments: Left anterior shoulder/left superior chest area/left upper arm: TTP, significant ecchymosis and soft tissue swelling noted, exam limited today due to pain  Skin:    General: Skin is warm.  Neurological:     General: No focal deficit present.     Mental Status: She is alert and oriented to person, place, and time.     UC Treatments / Results  Labs (all labs ordered are listed, but only abnormal results are displayed) Labs Reviewed - No data to display  EKG   Radiology DG Chest 2 View  Result Date: 05/05/2021 CLINICAL DATA:  Fall, pain EXAM: CHEST - 2 VIEW COMPARISON:  None. FINDINGS: Heart size and mediastinum appear within normal limits. Lungs are hyperinflated with no focal consolidation identified. No pleural effusion or pneumothorax. Acute appearing impacted fracture of the left humeral neck hand possible fracture versus chronic change of the right humeral neck. IMPRESSION: 1. Acute fracture of the left proximal humerus partially visualized. Chronic changes or possible fracture of the right humeral neck also visualized, correlate clinically. 2. Emphysematous changes. Electronically Signed   By: Jannifer Hick M.D.   On: 05/05/2021 11:37   DG Shoulder Left  Result Date: 05/05/2021 CLINICAL DATA:  Left shoulder pain EXAM: LEFT SHOULDER - 2+ VIEW COMPARISON:  None. FINDINGS: Acute comminuted fracture centered at the neck of the left humerus with impaction and displacement up to 2.5 cm. No dislocation. IMPRESSION: Acute comminuted fracture centered at the humeral neck. Electronically Signed   By: Jannifer Hick M.D.   On:  05/05/2021 11:38    Procedures Procedures (including critical care time)  Medications Ordered in UC Medications - No data to display  Initial Impression / Assessment and Plan / UC Course  I have reviewed the triage vital signs and the nursing notes.  Pertinent labs & imaging results that were available during my care of the patient were reviewed by me and considered in my medical decision making (see  chart for details).    MDM: 1.  Fall, initial encounter-x-rays revealed above; 2. Left humerus fracture-left shoulder collar/cuff fitted for patient prior to discharge, Rx'd Percocet.  Encouraged patient to follow-up with orthopedic provider today or tomorrow for further evaluation/Fracture management.  Patient is scheduled to meet with orthopedic surgeon on Tuesday morning, 05/11/2021 with Delbert Harness.  Patient discharged home, hemodynamically stable. Final Clinical Impressions(s) / UC Diagnoses   Final diagnoses:  Fall, initial encounter  Left supracondylar humerus fracture, closed, initial encounter     Discharge Instructions      Advised/instructed patient to wear left shoulder immobilizer 24/7 until following up with orthopedic provider.  Advised patient to use pain medication sparingly. Encouraged patient to follow-up with orthopedic provider today or tomorrow for further evaluation/Fracture management.     ED Prescriptions     Medication Sig Dispense Auth. Provider   oxyCODONE-acetaminophen (PERCOCET/ROXICET) 5-325 MG tablet Take 1-2 tablets by mouth every 8 (eight) hours as needed for severe pain. 30 tablet Trevor Iha, FNP      I have reviewed the PDMP during this encounter.   Trevor Iha, FNP 05/05/21 1226

## 2021-05-07 ENCOUNTER — Encounter: Payer: BC Managed Care – PPO | Admitting: Sports Medicine

## 2021-05-10 IMAGING — DX DG HUMERUS 2V *R*
2 series · 2 of 2 positions shown · non-contrast
Comparison: 04/23/2020

CLINICAL DATA: Follow-up fracture

EXAM:
RIGHT HUMERUS - 2+ VIEW

[humerus ap]
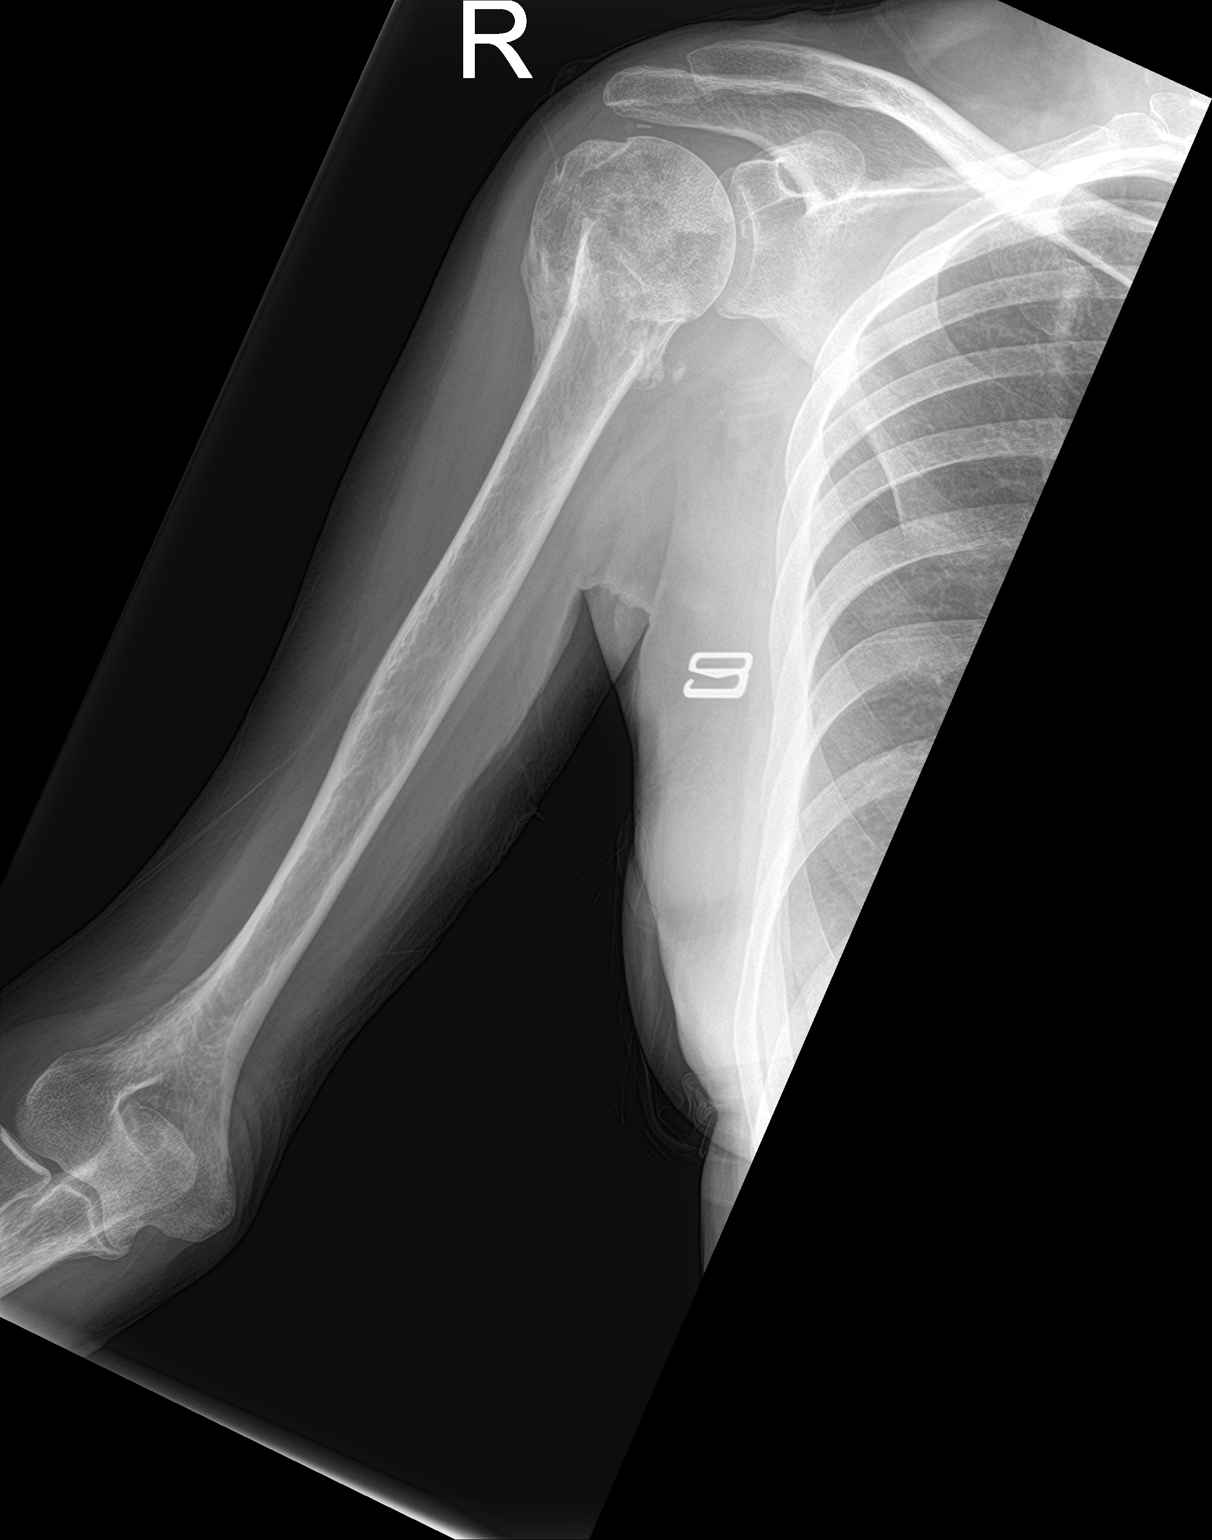

[humerus lat]
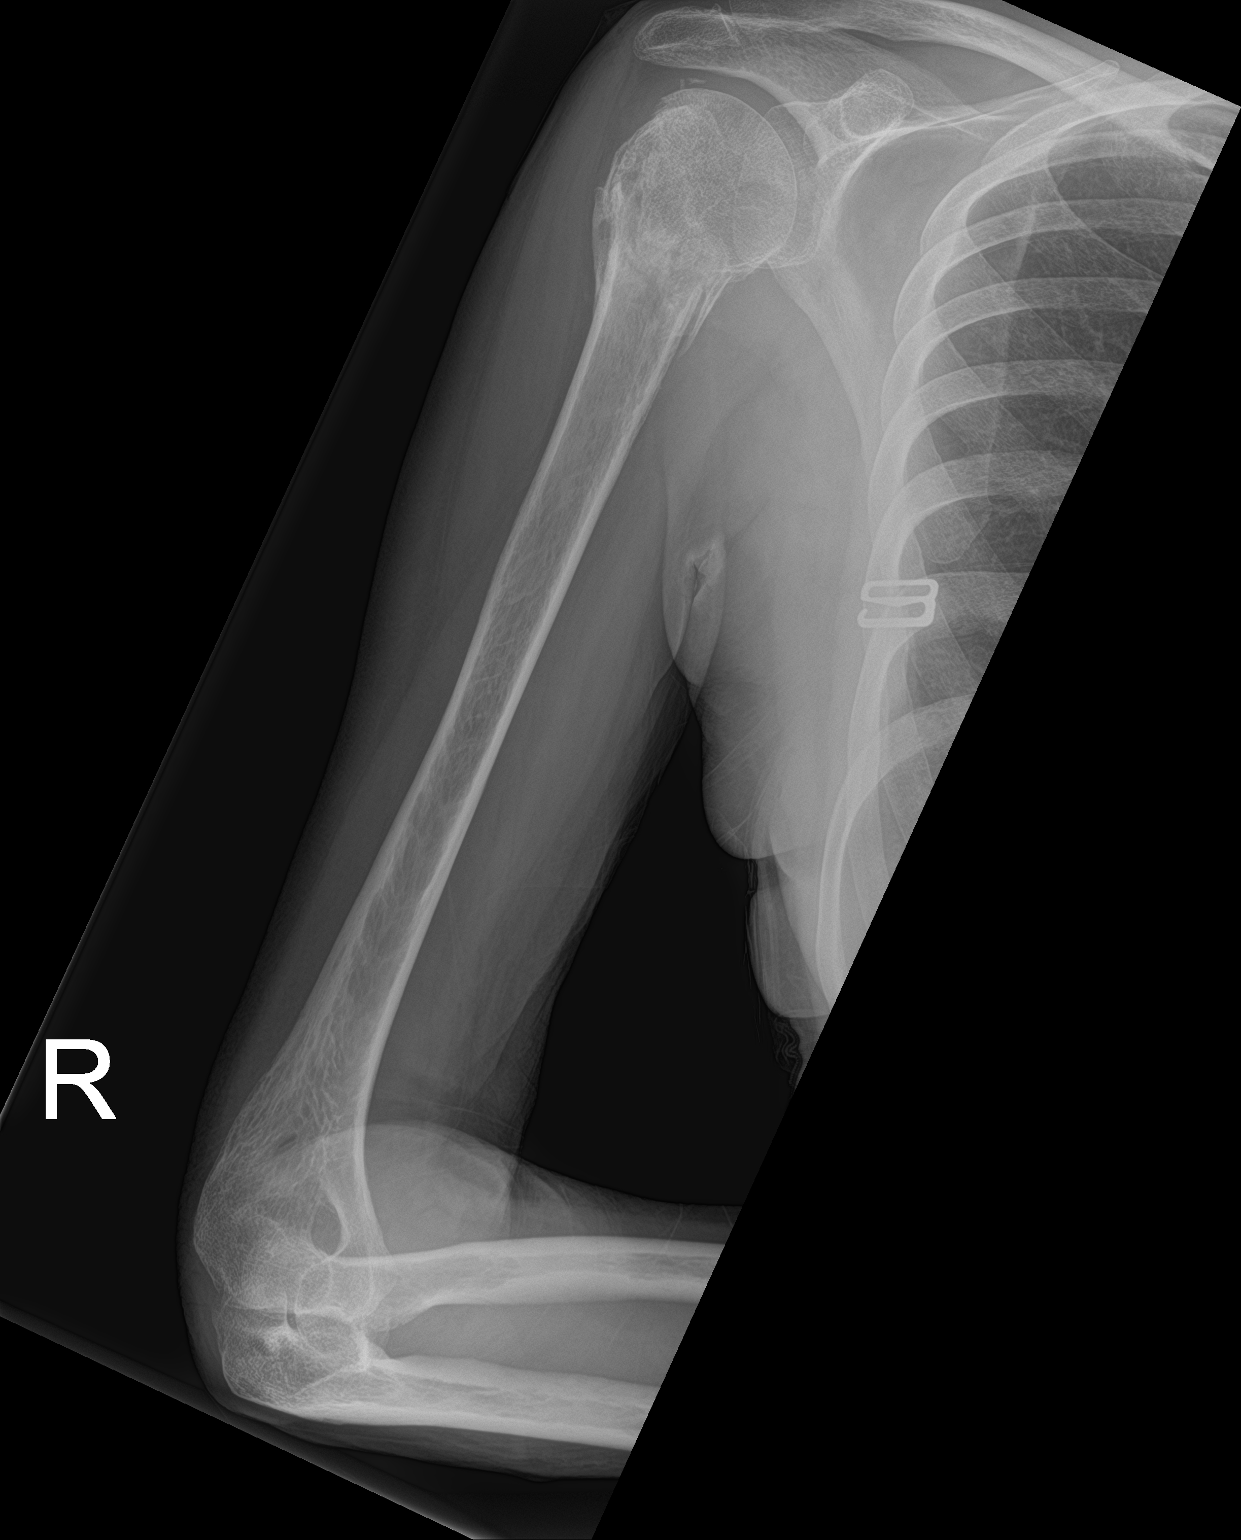

[2 of 2 positions shown; findings below may reference images not displayed]

FINDINGS: No significant change in alignment of the comminuted mildly
displaced proximal humerus fracture with progression of healing
changes. Humeral head alignment within normal limits. Punctate
osseous fragments versus tendinous calcifications superior to the
humeral head.
IMPRESSION: Stable alignment and progressive healing of proximal humerus
fracture.

## 2021-05-11 ENCOUNTER — Encounter (HOSPITAL_BASED_OUTPATIENT_CLINIC_OR_DEPARTMENT_OTHER): Payer: Self-pay | Admitting: Orthopaedic Surgery

## 2021-05-11 ENCOUNTER — Ambulatory Visit
Admission: RE | Admit: 2021-05-11 | Discharge: 2021-05-11 | Disposition: A | Discharge status: Home / Self Care | Payer: BC Managed Care – PPO | Source: Ambulatory Visit | Attending: Orthopaedic Surgery | Admitting: Orthopaedic Surgery

## 2021-05-11 ENCOUNTER — Other Ambulatory Visit: Payer: Self-pay | Admitting: Orthopaedic Surgery

## 2021-05-11 ENCOUNTER — Other Ambulatory Visit: Payer: Self-pay | Admitting: Internal Medicine

## 2021-05-11 ENCOUNTER — Other Ambulatory Visit: Payer: Self-pay

## 2021-05-11 DIAGNOSIS — T148XXA Other injury of unspecified body region, initial encounter: Secondary | ICD-10-CM

## 2021-05-11 NOTE — H&P (Signed)
PREOPERATIVE H&P  Chief Complaint: left proximal humerus fracture  HPI: Janet Harper is a 55 y.o. female who is scheduled for Procedure(s): REVERSE SHOULDER ARTHROPLASTY.   Patient has a past medical history significant for COPD.   Patient had a fall on 05/05/2021 when she tripped over her dog and fell into a door frame. She landed on her left shoulder. She had immediate pain. She went to an Urgent Care. X-rays showed a displaced proximal humerus fracture.  Symptoms are rated as moderate to severe, and have been worsening.  This is significantly impairing activities of daily living.    Please see clinic note for further details on this patient's care.    She has elected for surgical management.   Past Medical History:  Diagnosis Date   Anxiety    Asthma due to seasonal allergies    COPD (chronic obstructive pulmonary disease) (HCC)    Depression    Smoker    smokes 1ppd   Past Surgical History:  Procedure Laterality Date   ABLATION     TIBIA FRACTURE SURGERY Right    Social History   Socioeconomic History   Marital status: Legally Separated    Spouse name: Not on file   Number of children: Not on file   Years of education: Not on file   Highest education level: Not on file  Occupational History   Not on file  Tobacco Use   Smoking status: Every Day    Packs/day: 1.00    Types: Cigarettes   Smokeless tobacco: Never  Vaping Use   Vaping Use: Never used  Substance and Sexual Activity   Alcohol use: Yes    Comment: daily wine 1-2glasses   Drug use: Never   Sexual activity: Not on file    Comment: ablation  Other Topics Concern   Not on file  Social History Narrative   Not on file   Social Determinants of Health   Financial Resource Strain: Not on file  Food Insecurity: Not on file  Transportation Needs: Not on file  Physical Activity: Not on file  Stress: Not on file  Social Connections: Not on file   History reviewed. No pertinent family  history. Allergies  Allergen Reactions   Codeine Nausea Only   Prednisone    Prozac [Fluoxetine Hcl]    Prior to Admission medications   Medication Sig Start Date End Date Taking? Authorizing Provider  Calcium Carbonate-Vitamin D 600-400 MG-UNIT tablet Take 1 tablet by mouth 2 (two) times daily. 02/26/20  Yes Monica Becton, MD  cetirizine-pseudoephedrine (ZYRTEC-D) 5-120 MG tablet Take 1 tablet by mouth daily.   Yes [provider]  ibuprofen (ADVIL) 200 MG tablet Take 4 tablets (800 mg total) by mouth every 8 (eight) hours as needed. 04/23/20  Yes Monica Becton, MD  lamoTRIgine (LAMICTAL) 100 MG tablet TAKE 1 TABLET BY MOUTH QD(IF OUT OF LAMICTAL MORE THAN 1 WEEK DO NOT RESTART, CALL MD) 02/07/16  Yes [provider]  oxyCODONE-acetaminophen (PERCOCET/ROXICET) 5-325 MG tablet Take 1-2 tablets by mouth every 8 (eight) hours as needed for severe pain. 05/05/21  Yes Trevor Iha, FNP  sertraline (ZOLOFT) 50 MG tablet Take 50 mg by mouth daily. 01/09/20  Yes [provider]    ROS: All other systems have been reviewed and were otherwise negative with the exception of those mentioned in the HPI and as above.  Physical Exam: General: Alert, no acute distress Cardiovascular: No pedal edema Respiratory: No cyanosis, no use  of accessory musculature GI: No organomegaly, abdomen is soft and non-tender Skin: No lesions in the area of chief complaint Neurologic: Sensation intact distally Psychiatric: Patient is competent for consent with normal mood and affect Lymphatic: No axillary or cervical lymphadenopathy  MUSCULOSKELETAL:  Left shoulder: ROM not tested in setting of known fracture. NVI  Imaging: X-rays of left shoulder show a displaced proximal humerus fracture  Assessment: left proximal humerus fracture  Plan: Plan for Procedure(s): REVERSE SHOULDER ARTHROPLASTY  The risks benefits and alternatives were discussed with the patient  including but not limited to the risks of nonoperative treatment, versus surgical intervention including infection, bleeding, nerve injury,  blood clots, cardiopulmonary complications, morbidity, mortality, among others, and they were willing to proceed.   We additionally specifically discussed risks of axillary nerve injury, infection, periprosthetic fracture, continued pain and longevity of implants prior to beginning procedure.    Patient will be closely monitored in PACU for medical stabilization and pain control. If found stable in PACU, patient may be discharged home with outpatient follow-up. If any concerns regarding patient's stabilization patient will be admitted for observation after surgery. The patient is planning to be discharged home with outpatient PT.   The patient acknowledged the explanation, agreed to proceed with the plan and consent was signed.   Operative Plan: Left reverse total shoulder arthroplasty for fracture Discharge Medications: Standard DVT Prophylaxis: Aspirin Physical Therapy: Outpatient PT Special Discharge needs: Sling. IceMan   Vernetta Honey, PA-C  05/11/2021 4:43 PM

## 2021-05-12 NOTE — Discharge Instructions (Addendum)
Ophelia Charter MD, MPH Noemi Chapel, PA-C [facility] [address] [phone] (tel)   [phone] (fax)   [place] may leave the operative dressing in place until your follow-up appointment. KEEP THE INCISIONS CLEAN AND DRY. There may be a small amount of fluid/bleeding leaking at the surgical site. This is normal after surgery.  If it fills with liquid or blood please call us immediately to change it for you. Use the provided ice machine or Ice packs as often as possible for the first 3-4 days, then as needed for pain relief.   Keep a layer of cloth or a shirt between your skin and the cooling unit to prevent frost bite as it can get very cold.  SHOWERING: - You may shower on Post-Op Day #2.  - The dressing is water resistant but do not scrub it as it may start to peel up.   - You may remove the sling for showering, but keep a water resistant pillow under the arm to keep both the  elbow and shoulder away from the body (mimicking the abduction sling).  - Gently pat the area dry.  - Do not soak the shoulder in water. Do not go swimming in the pool or ocean until your incision has completely healed (about 4 to 6 weeks after surgery) - KEEP THE INCISIONS CLEAN AND DRY.  EXERCISES Wear the sling at all times (including when you sleep!) You may remove the sling for showering, but keep the arm across the chest or in a secondary sling.    Accidental/Purposeful External Rotation and shoulder flexion (reaching behind you) is to be avoided at all costs for the first month. It is ok to come out of your sling if your are sitting and have assistance for eating.   Do not lift anything heavier than 1 pound until we discuss it further in clinic.   REGIONAL ANESTHESIA (NERVE BLOCKS) The anesthesia team may have performed a nerve block for you if safe in the setting of your care.  This is a great  tool used to minimize pain.  Typically the block may start wearing off overnight but the long acting medicine may last for 3-4 days.  The nerve block wearing off can be a challenging period but please utilize your as needed pain medications to try and manage this period.    POST-OP MEDICATIONS- Multimodal approach to pain control In general your pain will be controlled with a combination of substances.  Prescriptions unless otherwise discussed are electronically sent to your pharmacy.  This is a carefully made plan we use to minimize narcotic use.     Meloxicam - Anti-inflammatory medication taken on a scheduled basis Acetaminophen - Non-narcotic pain medicine taken on a scheduled basis  Gabapentin - this is a medication to help with pain, take on a scheduled basis Oxycodone - This is a strong narcotic, to be used only on an "as needed" basis for SEVERE pain. Aspirin 81mg  - This medicine is used to minimize the risk of blood clots after surgery. Omeprazole - daily medicine to protect your stomach while taking anti-inflammatories.   Zofran -  take as needed for nausea   FOLLOW-UP If you develop a Fever (>101.5), Redness or Drainage from the surgical incision site, please call our office to arrange for an evaluation. Please call the office to schedule a follow-up appointment for a wound check, 7-10 days post-operatively.  IF YOU HAVE ANY QUESTIONS, PLEASE FEEL FREE TO CALL OUR OFFICE.  HELPFUL INFORMATION  If you had a block, it will wear off between 8-24 hrs postop typically.  This is period when your pain may go from nearly zero to the pain you would have had post-op without the block.  This is an abrupt transition but nothing dangerous is happening.  You may take an extra dose of narcotic when this happens.  Your arm will be in a sling following surgery. You will be in this sling for the next 4 weeks.  I will let you know the exact duration at your follow-up visit.  You may be more  comfortable sleeping in a semi-seated position the first few nights following surgery.  Keep a pillow propped under the elbow and forearm for comfort.  If you have a recliner type of chair it might be beneficial.  If not that is fine too, but it would be helpful to sleep propped up with pillows behind your operated shoulder as well under your elbow and forearm.  This will reduce pulling on the suture lines.  When dressing, put your operative arm in the sleeve first.  When getting undressed, take your operative arm out last.  Loose fitting, button-down shirts are recommended.  In most states it is against the law to drive while your arm is in a sling. And certainly against the law to drive while taking narcotics.  You may return to work/school in the next couple of days when you feel up to it. Desk work and typing in the sling is fine.  We suggest you use the pain medication the first night prior to going to bed, in order to ease any pain when the anesthesia wears off. You should avoid taking pain medications on an empty stomach as it will make you nauseous.  Do not drink alcoholic beverages or take illicit drugs when taking pain medications.  You should wean off of the pain medication as soon as you are able, most patients are off of narcotics by their first post-op visit  Pain medication may make you constipated.  Below are a few solutions to try in this order: Decrease the amount of pain medication if you aren't having pain. Drink lots of decaffeinated fluids. Drink prune juice and/or each dried prunes  If the first 3 don't work start with additional solutions Take Colace - an over-the-counter stool softener Take Senokot - an over-the-counter laxative Take Miralax - a stronger over-the-counter laxative   Dental Antibiotics:  In most cases prophylactic antibiotics for Dental procdeures after total joint surgery are not necessary.  Exceptions are as follows:  1. History of prior total  joint infection  2. Severely immunocompromised (Organ Transplant, cancer chemotherapy, Rheumatoid biologic meds such as Humera)  3. Poorly controlled diabetes (A1C &gt; 8.0, blood glucose over 200)  If you have one of these conditions, contact your surgeon for an antibiotic prescription, prior to your dental procedure.   For more information including helpful videos and documents visit our website:   [email or web address]   Post Anesthesia Home Care Instructions  Activity: Get plenty of rest for the remainder of the day. A responsible individual must stay with you for 24 hours following the procedure.  For the next 24 hours, DO NOT: -Drive a car -Advertising copywriter -Drink alcoholic beverages -Take any medication unless instructed by your physician -Make any legal decisions or sign important papers.  Meals: Start with liquid foods such as gelatin or soup.  Progress to regular foods as tolerated. Avoid greasy, spicy, heavy foods. If nausea and/or vomiting occur, drink only clear liquids until the nausea and/or vomiting subsides. Call your physician if vomiting continues.  Special Instructions/Symptoms: Your throat may feel dry or sore from the anesthesia or the breathing tube placed in your throat during surgery. If this causes discomfort, gargle with warm salt water. The discomfort should disappear within 24 hours.  If you had a scopolamine patch placed behind your ear for the management of post- operative nausea and/or vomiting:  1. The medication in the patch is effective for 72 hours, after which it should be removed.  Wrap patch in a tissue and discard in the trash. Wash hands thoroughly with soap and water. 2. You may remove the patch earlier than 72 hours if you experience unpleasant side effects which may include dry mouth, dizziness or visual disturbances. 3. Avoid touching the patch. Wash your hands with soap and water after contact with the  patch.    Regional Anesthesia Blocks  1. Numbness or the inability to move the "blocked" extremity may last from 3-48 hours after placement. The length of time depends on the medication injected and your individual response to the medication. If the numbness is not going away after 48 hours, call your surgeon.  2. The extremity that is blocked will need to be protected until the numbness is gone and the  Strength has returned. Because you cannot feel it, you will need to take extra care to avoid injury. Because it may be weak, you may have difficulty moving it or using it. You may not know what position it is in without looking at it while the block is in effect.  3. For blocks in the legs and feet, returning to weight bearing and walking needs to be done carefully. You will need to wait until the numbness is entirely gone and the strength has returned. You should be able to move your leg and foot normally before you try and bear weight or walk. You will need someone to be with you when you first try to ensure you do not fall and possibly risk injury.  4. Bruising and tenderness at the needle site are common side effects and will resolve in a few days.  5. Persistent numbness or new problems with movement should be communicated to the surgeon or the [facility] [phone] [facility] [phone]).   Information for Discharge Teaching: EXPAREL (bupivacaine liposome injectable suspension)   Your surgeon or anesthesiologist gave you EXPAREL(bupivacaine) to help control your pain after surgery.  EXPAREL is a local anesthetic that provides pain relief by numbing the tissue around the surgical site. EXPAREL is designed to release pain medication over time and can control pain for up to 72 hours. Depending on how you respond to EXPAREL, you may require less pain medication during your recovery.  Possible side effects: Temporary loss of sensation or ability to move in  the area where bupivacaine was injected. Nausea, vomiting, constipation Rarely, numbness and tingling in your mouth or lips, lightheadedness, or anxiety may occur. Call your doctor right away if you think you may be experiencing any of these sensations, or if you have other questions regarding possible side effects.  Follow all other discharge instructions given to you by your surgeon or nurse. Eat a healthy diet and drink plenty of water or other fluids.  If you return to the hospital for any reason within 96 hours following  the administration of EXPAREL, it is important for health care providers to know that you have received this anesthetic. A teal colored band has been placed on your arm with the date, time and amount of EXPAREL you have received in order to alert and inform your health care providers. Please leave this armband in place for the full 96 hours following administration, and then you may remove the band.

## 2021-05-13 ENCOUNTER — Ambulatory Visit (HOSPITAL_COMMUNITY): Payer: BC Managed Care – PPO

## 2021-05-13 ENCOUNTER — Other Ambulatory Visit: Payer: Self-pay

## 2021-05-13 ENCOUNTER — Encounter (HOSPITAL_BASED_OUTPATIENT_CLINIC_OR_DEPARTMENT_OTHER): Payer: Self-pay | Admitting: Orthopaedic Surgery

## 2021-05-13 ENCOUNTER — Ambulatory Visit (HOSPITAL_BASED_OUTPATIENT_CLINIC_OR_DEPARTMENT_OTHER): Payer: BC Managed Care – PPO | Admitting: Certified Registered"

## 2021-05-13 ENCOUNTER — Encounter (HOSPITAL_BASED_OUTPATIENT_CLINIC_OR_DEPARTMENT_OTHER)
Admission: RE | Disposition: A | Discharge status: Home / Self Care | Payer: Self-pay | Source: Home / Self Care | Attending: Orthopaedic Surgery

## 2021-05-13 ENCOUNTER — Ambulatory Visit (HOSPITAL_BASED_OUTPATIENT_CLINIC_OR_DEPARTMENT_OTHER)
Admission: RE | Admit: 2021-05-13 | Discharge: 2021-05-13 | Disposition: A | Discharge status: Home / Self Care | Payer: BC Managed Care – PPO | Attending: Orthopaedic Surgery | Admitting: Orthopaedic Surgery

## 2021-05-13 DIAGNOSIS — F32A Depression, unspecified: Secondary | ICD-10-CM | POA: Diagnosis not present

## 2021-05-13 DIAGNOSIS — W01198A Fall on same level from slipping, tripping and stumbling with subsequent striking against other object, initial encounter: Secondary | ICD-10-CM | POA: Diagnosis not present

## 2021-05-13 DIAGNOSIS — F17210 Nicotine dependence, cigarettes, uncomplicated: Secondary | ICD-10-CM | POA: Insufficient documentation

## 2021-05-13 DIAGNOSIS — F419 Anxiety disorder, unspecified: Secondary | ICD-10-CM | POA: Insufficient documentation

## 2021-05-13 DIAGNOSIS — S42292A Other displaced fracture of upper end of left humerus, initial encounter for closed fracture: Secondary | ICD-10-CM | POA: Diagnosis not present

## 2021-05-13 DIAGNOSIS — J449 Chronic obstructive pulmonary disease, unspecified: Secondary | ICD-10-CM | POA: Diagnosis not present

## 2021-05-13 DIAGNOSIS — M7522 Bicipital tendinitis, left shoulder: Secondary | ICD-10-CM | POA: Diagnosis not present

## 2021-05-13 DIAGNOSIS — M85812 Other specified disorders of bone density and structure, left shoulder: Secondary | ICD-10-CM | POA: Insufficient documentation

## 2021-05-13 DIAGNOSIS — Z09 Encounter for follow-up examination after completed treatment for conditions other than malignant neoplasm: Secondary | ICD-10-CM

## 2021-05-13 HISTORY — DX: Unspecified asthma, uncomplicated: J45.909

## 2021-05-13 HISTORY — DX: Chronic obstructive pulmonary disease, unspecified: J44.9

## 2021-05-13 HISTORY — DX: Depression, unspecified: F32.A

## 2021-05-13 HISTORY — PX: REVERSE SHOULDER ARTHROPLASTY: SHX5054

## 2021-05-13 HISTORY — DX: Nicotine dependence, unspecified, uncomplicated: F17.200

## 2021-05-13 HISTORY — DX: Anxiety disorder, unspecified: F41.9

## 2021-05-13 SURGERY — ARTHROPLASTY, SHOULDER, TOTAL, REVERSE
Anesthesia: General | Site: Shoulder | Laterality: Left

## 2021-05-13 MED ORDER — DEXAMETHASONE SODIUM PHOSPHATE 10 MG/ML IJ SOLN
INTRAMUSCULAR | Status: AC
  Filled 2021-05-13: qty 1

## 2021-05-13 MED ORDER — MIDAZOLAM HCL 2 MG/2ML IJ SOLN
INTRAMUSCULAR | Status: AC
  Filled 2021-05-13: qty 2

## 2021-05-13 MED ORDER — 0.9 % SODIUM CHLORIDE (POUR BTL) OPTIME
TOPICAL | Status: DC | PRN
  Administered 2021-05-13: 11:00:00 1000 mL

## 2021-05-13 MED ORDER — TRANEXAMIC ACID-NACL 1000-0.7 MG/100ML-% IV SOLN
INTRAVENOUS | Status: AC
  Filled 2021-05-13: qty 100

## 2021-05-13 MED ORDER — ACETAMINOPHEN 500 MG PO TABS: 1000.0000 mg | ORAL_TABLET | Freq: Three times a day (TID) | ORAL | 0 refills | Status: AC

## 2021-05-13 MED ORDER — VANCOMYCIN HCL 1000 MG IV SOLR
INTRAVENOUS | Status: DC | PRN
  Administered 2021-05-13: 1000 mg via TOPICAL

## 2021-05-13 MED ORDER — ROCURONIUM BROMIDE 100 MG/10ML IV SOLN
INTRAVENOUS | Status: DC | PRN
  Administered 2021-05-13: 60 mg via INTRAVENOUS

## 2021-05-13 MED ORDER — OXYCODONE HCL 5 MG PO TABS: ORAL_TABLET | ORAL | 0 refills | Status: AC

## 2021-05-13 MED ORDER — ONDANSETRON HCL 4 MG/2ML IJ SOLN
INTRAMUSCULAR | Status: DC | PRN
  Administered 2021-05-13: 4 mg via INTRAVENOUS

## 2021-05-13 MED ORDER — CEFAZOLIN SODIUM-DEXTROSE 2-4 GM/100ML-% IV SOLN
INTRAVENOUS | Status: AC
  Filled 2021-05-13: qty 100

## 2021-05-13 MED ORDER — LACTATED RINGERS IV SOLN: INTRAVENOUS | Status: DC

## 2021-05-13 MED ORDER — GABAPENTIN 100 MG PO CAPS: 100.0000 mg | ORAL_CAPSULE | Freq: Three times a day (TID) | ORAL | 0 refills | Status: AC

## 2021-05-13 MED ORDER — OMEPRAZOLE 20 MG PO CPDR: 20.0000 mg | DELAYED_RELEASE_CAPSULE | Freq: Every day | ORAL | 0 refills | Status: AC

## 2021-05-13 MED ORDER — ROCURONIUM BROMIDE 10 MG/ML (PF) SYRINGE
PREFILLED_SYRINGE | INTRAVENOUS | Status: AC
  Filled 2021-05-13: qty 10

## 2021-05-13 MED ORDER — LIDOCAINE 2% (20 MG/ML) 5 ML SYRINGE
INTRAMUSCULAR | Status: AC
  Filled 2021-05-13: qty 5

## 2021-05-13 MED ORDER — OXYCODONE HCL 5 MG PO TABS: 5.0000 mg | ORAL_TABLET | Freq: Once | ORAL | Status: DC | PRN

## 2021-05-13 MED ORDER — ASPIRIN 81 MG PO CHEW: 81.0000 mg | CHEWABLE_TABLET | Freq: Two times a day (BID) | ORAL | 0 refills | Status: AC

## 2021-05-13 MED ORDER — ONDANSETRON HCL 4 MG PO TABS: 4.0000 mg | ORAL_TABLET | Freq: Three times a day (TID) | ORAL | 0 refills | Status: AC | PRN

## 2021-05-13 MED ORDER — ONDANSETRON HCL 4 MG/2ML IJ SOLN
INTRAMUSCULAR | Status: AC
  Filled 2021-05-13: qty 2

## 2021-05-13 MED ORDER — BUPIVACAINE HCL (PF) 0.5 % IJ SOLN
INTRAMUSCULAR | Status: DC | PRN
  Administered 2021-05-13: 20 mL via PERINEURAL

## 2021-05-13 MED ORDER — FENTANYL CITRATE (PF) 100 MCG/2ML IJ SOLN
INTRAMUSCULAR | Status: AC
  Filled 2021-05-13: qty 2

## 2021-05-13 MED ORDER — CEFAZOLIN SODIUM-DEXTROSE 2-4 GM/100ML-% IV SOLN
2.0000 g | INTRAVENOUS | Status: AC
  Administered 2021-05-13: 09:00:00 2 g via INTRAVENOUS

## 2021-05-13 MED ORDER — PHENYLEPHRINE HCL-NACL 20-0.9 MG/250ML-% IV SOLN
INTRAVENOUS | Status: DC | PRN
  Administered 2021-05-13: 50 ug/min via INTRAVENOUS

## 2021-05-13 MED ORDER — PROPOFOL 10 MG/ML IV BOLUS
INTRAVENOUS | Status: DC | PRN
  Administered 2021-05-13: 130 mg via INTRAVENOUS

## 2021-05-13 MED ORDER — LIDOCAINE 2% (20 MG/ML) 5 ML SYRINGE
INTRAMUSCULAR | Status: DC | PRN
Start: 2021-05-13 — End: 2021-05-13
  Administered 2021-05-13: 40 mg via INTRAVENOUS

## 2021-05-13 MED ORDER — HYDROMORPHONE HCL 1 MG/ML IJ SOLN: 0.2500 mg | INTRAMUSCULAR | Status: DC | PRN

## 2021-05-13 MED ORDER — FENTANYL CITRATE (PF) 100 MCG/2ML IJ SOLN
100.0000 ug | Freq: Once | INTRAMUSCULAR | Status: AC
  Administered 2021-05-13: 09:00:00 100 ug via INTRAVENOUS

## 2021-05-13 MED ORDER — PHENYLEPHRINE HCL (PRESSORS) 10 MG/ML IV SOLN
INTRAVENOUS | Status: DC | PRN
  Administered 2021-05-13: 80 ug via INTRAVENOUS
  Administered 2021-05-13: 40 ug via INTRAVENOUS
  Administered 2021-05-13: 80 ug via INTRAVENOUS

## 2021-05-13 MED ORDER — BUPIVACAINE LIPOSOME 1.3 % IJ SUSP
INTRAMUSCULAR | Status: DC | PRN
  Administered 2021-05-13: 10 mL via PERINEURAL

## 2021-05-13 MED ORDER — PHENYLEPHRINE HCL (PRESSORS) 10 MG/ML IV SOLN
INTRAVENOUS | Status: AC
  Filled 2021-05-13: qty 2

## 2021-05-13 MED ORDER — MEPERIDINE HCL 25 MG/ML IJ SOLN: 6.2500 mg | INTRAMUSCULAR | Status: DC | PRN

## 2021-05-13 MED ORDER — AMISULPRIDE (ANTIEMETIC) 5 MG/2ML IV SOLN: 10.0000 mg | Freq: Once | INTRAVENOUS | Status: DC | PRN

## 2021-05-13 MED ORDER — PROPOFOL 10 MG/ML IV BOLUS
INTRAVENOUS | Status: AC
  Filled 2021-05-13: qty 20

## 2021-05-13 MED ORDER — MELOXICAM 15 MG PO TABS: 15.0000 mg | ORAL_TABLET | Freq: Every day | ORAL | 0 refills | Status: AC

## 2021-05-13 MED ORDER — OXYCODONE HCL 5 MG/5ML PO SOLN: 5.0000 mg | Freq: Once | ORAL | Status: DC | PRN

## 2021-05-13 MED ORDER — SODIUM CHLORIDE (PF) 0.9 % IJ SOLN
INTRAMUSCULAR | Status: DC | PRN
  Administered 2021-05-13: 1000 mL

## 2021-05-13 MED ORDER — MIDAZOLAM HCL 2 MG/2ML IJ SOLN
2.0000 mg | Freq: Once | INTRAMUSCULAR | Status: AC
  Administered 2021-05-13: 09:00:00 2 mg via INTRAVENOUS

## 2021-05-13 MED ORDER — SUGAMMADEX SODIUM 200 MG/2ML IV SOLN
INTRAVENOUS | Status: DC | PRN
  Administered 2021-05-13: 150 mg via INTRAVENOUS

## 2021-05-13 MED ORDER — TRANEXAMIC ACID-NACL 1000-0.7 MG/100ML-% IV SOLN
INTRAVENOUS | Status: DC | PRN
  Administered 2021-05-13: 1000 mg via INTRAVENOUS

## 2021-05-13 MED ORDER — EPHEDRINE SULFATE 50 MG/ML IJ SOLN
INTRAMUSCULAR | Status: DC | PRN
  Administered 2021-05-13: 5 mg via INTRAVENOUS
  Administered 2021-05-13: 10 mg via INTRAVENOUS

## 2021-05-13 MED ORDER — PHENYLEPHRINE 40 MCG/ML (10ML) SYRINGE FOR IV PUSH (FOR BLOOD PRESSURE SUPPORT)
PREFILLED_SYRINGE | INTRAVENOUS | Status: AC
  Filled 2021-05-13: qty 10

## 2021-05-13 MED ORDER — PROMETHAZINE HCL 25 MG/ML IJ SOLN: 6.2500 mg | INTRAMUSCULAR | Status: DC | PRN

## 2021-05-13 SURGICAL SUPPLY — 74 items
AID PSTN UNV HD RSTRNT DISP (MISCELLANEOUS) ×1
APL PRP STRL LF DISP 70% ISPRP (MISCELLANEOUS) ×1
BASEPLATE GLENOSPHERE 25 STD (Miscellaneous) ×2 IMPLANT
BIT DRILL 3.2 PERIPHERAL SCREW (BIT) ×2 IMPLANT
BLADE HEX COATED 2.75 (ELECTRODE) IMPLANT
BLADE SAW SAG 73X25 THK (BLADE) ×1
BLADE SAW SGTL 73X25 THK (BLADE) ×1 IMPLANT
BLADE SURG 10 STRL SS (BLADE) IMPLANT
BLADE SURG 15 STRL LF DISP TIS (BLADE) IMPLANT
BLADE SURG 15 STRL SS (BLADE)
BNDG COHESIVE 4X5 TAN ST LF (GAUZE/BANDAGES/DRESSINGS) IMPLANT
BODY PROXIMAL PTC 11 132.5D (Spacer) ×1 IMPLANT
BRUSH SCRUB EZ PLAIN DRY (MISCELLANEOUS) ×2 IMPLANT
BSPLAT GLND STD 25 RVRS SHLDR (Miscellaneous) ×1 IMPLANT
CAP LOCKING COCR (Cap) ×2 IMPLANT
CHLORAPREP W/TINT 26 (MISCELLANEOUS) ×2 IMPLANT
CLSR STERI-STRIP ANTIMIC 1/2X4 (GAUZE/BANDAGES/DRESSINGS) ×2 IMPLANT
COOLER ICEMAN CLASSIC (MISCELLANEOUS) ×2 IMPLANT
COVER BACK TABLE 60X90IN (DRAPES) ×2 IMPLANT
COVER MAYO STAND STRL (DRAPES) ×2 IMPLANT
DECANTER SPIKE VIAL GLASS SM (MISCELLANEOUS) IMPLANT
DRAPE IMP U-DRAPE 54X76 (DRAPES) ×2 IMPLANT
DRAPE INCISE IOBAN 66X45 STRL (DRAPES) ×2 IMPLANT
DRAPE U-SHAPE 76X120 STRL (DRAPES) ×4 IMPLANT
DRSG AQUACEL AG ADV 3.5X 6 (GAUZE/BANDAGES/DRESSINGS) ×2 IMPLANT
ELECT BLADE 4.0 EZ CLEAN MEGAD (MISCELLANEOUS) ×2
ELECT REM PT RETURN 9FT ADLT (ELECTROSURGICAL) ×2
ELECTRODE BLDE 4.0 EZ CLN MEGD (MISCELLANEOUS) ×1 IMPLANT
ELECTRODE REM PT RTRN 9FT ADLT (ELECTROSURGICAL) ×1 IMPLANT
FACESHIELD WRAPAROUND (MASK) ×4 IMPLANT
GLENOSPHERE REV SHOULDER 36 (Joint) ×2 IMPLANT
GLOVE SRG 8 PF TXTR STRL LF DI (GLOVE) ×1 IMPLANT
GLOVE SURG ENC MOIS LTX SZ6.5 (GLOVE) ×2 IMPLANT
GLOVE SURG LTX SZ8 (GLOVE) ×2 IMPLANT
GLOVE SURG UNDER POLY LF SZ6.5 (GLOVE) ×2 IMPLANT
GLOVE SURG UNDER POLY LF SZ8 (GLOVE) ×2
GOWN STRL REUS W/ TWL LRG LVL3 (GOWN DISPOSABLE) ×2 IMPLANT
GOWN STRL REUS W/TWL LRG LVL3 (GOWN DISPOSABLE) ×4
GOWN STRL REUS W/TWL XL LVL3 (GOWN DISPOSABLE) ×2 IMPLANT
GUIDEWIRE GLENOID 2.5X220 (WIRE) ×2 IMPLANT
HANDPIECE INTERPULSE COAX TIP (DISPOSABLE) ×2
IMPL REVERSE SHOULDER 0X3.5 (Shoulder) ×1 IMPLANT
IMPLANT REVERSE SHOULDER 0X3.5 (Shoulder) ×2 IMPLANT
INSERT HUMERAL 36X6MM 12.5DEG (Insert) ×2 IMPLANT
KIT STABILIZATION SHOULDER (MISCELLANEOUS) ×2 IMPLANT
MANIFOLD NEPTUNE II (INSTRUMENTS) ×2 IMPLANT
PACK BASIN DAY SURGERY FS (CUSTOM PROCEDURE TRAY) ×2 IMPLANT
PACK SHOULDER (CUSTOM PROCEDURE TRAY) ×2 IMPLANT
PAD COLD SHLDR WRAP-ON (PAD) ×2 IMPLANT
PAD ORTHO SHOULDER 7X19 LRG (SOFTGOODS) IMPLANT
PENCIL SMOKE EVACUATOR (MISCELLANEOUS) IMPLANT
PROXIMAL BODY PTC 11 132.5D (Spacer) ×2 IMPLANT
RESTRAINT HEAD UNIVERSAL NS (MISCELLANEOUS) ×2 IMPLANT
SCREW 5.0X38 SMALL F/PERFORM (Screw) ×2 IMPLANT
SCREW 5.5X26 (Screw) ×2 IMPLANT
SCREW BONE THREAD 6.5X35 (Screw) ×2 IMPLANT
SCREW SHOULDER 30 (Miscellaneous) ×2 IMPLANT
SET HNDPC FAN SPRY TIP SCT (DISPOSABLE) ×1 IMPLANT
SHEET MEDIUM DRAPE 40X70 STRL (DRAPES) ×2 IMPLANT
SLEEVE SCD COMPRESS KNEE MED (STOCKING) ×2 IMPLANT
SPACER SHLD CMT AEQ 11X30 (Miscellaneous) ×2 IMPLANT
SPONGE T-LAP 18X18 ~~LOC~~+RFID (SPONGE) IMPLANT
STEM PRTL DISTAL 11 SHOULDER (Miscellaneous) ×2 IMPLANT
SUT ETHIBOND 2 V 37 (SUTURE) ×2 IMPLANT
SUT ETHIBOND NAB CT1 #1 30IN (SUTURE) ×2 IMPLANT
SUT FIBERWIRE #5 38 CONV NDL (SUTURE) ×8
SUT MNCRL AB 4-0 PS2 18 (SUTURE) IMPLANT
SUT VIC AB 0 CT1 27 (SUTURE)
SUT VIC AB 0 CT1 27XCR 8 STRN (SUTURE) IMPLANT
SUT VIC AB 3-0 SH 27 (SUTURE) ×2
SUT VIC AB 3-0 SH 27X BRD (SUTURE) ×1 IMPLANT
SUTURE FIBERWR #5 38 CONV NDL (SUTURE) ×4 IMPLANT
TOWEL GREEN STERILE FF (TOWEL DISPOSABLE) ×6 IMPLANT
TUBE SUCTION HIGH CAP CLEAR NV (SUCTIONS) ×2 IMPLANT

## 2021-05-13 NOTE — Anesthesia Procedure Notes (Signed)
Procedure Name: Intubation Date/Time: 05/13/2021 9:19 AM Performed by: Lavonia Dana, CRNA Pre-anesthesia Checklist: Patient identified, Emergency Drugs available, Suction available and Patient being monitored Patient Re-evaluated:Patient Re-evaluated prior to induction Oxygen Delivery Method: Circle system utilized Preoxygenation: Pre-oxygenation with 100% oxygen Induction Type: IV induction Ventilation: Mask ventilation without difficulty Laryngoscope Size: Mac and 3 Tube type: Oral Tube size: 7.0 mm Number of attempts: 1 Airway Equipment and Method: Stylet and Oral airway Placement Confirmation: ETT inserted through vocal cords under direct vision, positive ETCO2 and breath sounds checked- equal and bilateral Secured at: 22 cm Tube secured with: Tape Dental Injury: Teeth and Oropharynx as per pre-operative assessment

## 2021-05-13 NOTE — Anesthesia Procedure Notes (Signed)
Anesthesia Regional Block: Interscalene brachial plexus block   Pre-Anesthetic Checklist: , timeout performed,  Correct Patient, Correct Site, Correct Laterality,  Correct Procedure, Correct Position, site marked,  Risks and benefits discussed,  Surgical consent,  Pre-op evaluation,  At surgeon's request and post-op pain management  Laterality: Left  Prep: chloraprep       Needles:  Injection technique: Single-shot  Needle Type: Stimiplex     Needle Length: 9cm  Needle Gauge: 21     Additional Needles:   Procedures:,,,, ultrasound used (permanent image in chart),,    Narrative:  Start time: 05/13/2021 8:49 AM End time: 05/13/2021 8:54 AM Injection made incrementally with aspirations every 5 mL.  Performed by: Personally  Anesthesiologist: Lowella Curb, MD

## 2021-05-13 NOTE — Anesthesia Preprocedure Evaluation (Signed)
Anesthesia Evaluation  Patient identified by MRN, date of birth, ID band Patient awake    Reviewed: Allergy & Precautions, H&P , NPO status , Patient's Chart, lab work & pertinent test results  Airway Mallampati: II  TM Distance: >3 FB Neck ROM: Full    Dental no notable dental hx.    Pulmonary asthma , COPD, Current Smoker and Patient abstained from smoking.,    Pulmonary exam normal breath sounds clear to auscultation       Cardiovascular negative cardio ROS Normal cardiovascular exam Rhythm:Regular Rate:Normal     Neuro/Psych Anxiety Depression negative neurological ROS  negative psych ROS   GI/Hepatic negative GI ROS, Neg liver ROS,   Endo/Other  negative endocrine ROS  Renal/GU negative Renal ROS  negative genitourinary   Musculoskeletal negative musculoskeletal ROS (+)   Abdominal   Peds negative pediatric ROS (+)  Hematology negative hematology ROS (+)   Anesthesia Other Findings   Reproductive/Obstetrics negative OB ROS                             Anesthesia Physical Anesthesia Plan  ASA: 3  Anesthesia Plan: General   Post-op Pain Management:    Induction: Intravenous  PONV Risk Score and Plan: 2 and Ondansetron, Midazolam and Treatment may vary due to age or medical condition  Airway Management Planned: Oral ETT  Additional Equipment:   Intra-op Plan:   Post-operative Plan: Extubation in OR  Informed Consent: I have reviewed the patients History and Physical, chart, labs and discussed the procedure including the risks, benefits and alternatives for the proposed anesthesia with the patient or authorized representative who has indicated his/her understanding and acceptance.     Dental advisory given  Plan Discussed with: CRNA  Anesthesia Plan Comments:         Anesthesia Quick Evaluation

## 2021-05-13 NOTE — Op Note (Signed)
Orthopaedic Surgery Operative Note (CSN: [id number])  Janet Harper  06-13-1966 Date of Surgery: 05/13/2021   Diagnoses:  Left proximal humerus fracture with bone loss  Procedure: Left reverse total Shoulder Arthroplasty Open reduction internal fixation of tuberosity fractures   Operative Finding Successful completion of planned procedure.  We initially attempted to use a Tornier flex stem however the patient had a significant area of bone loss proximally in the proximal metadiaphyseal region with a split.  We felt that the flex stem would likely have a high risk of loosening based on this.  We used a revive stem for backup.  We had obtained good fixation with this.  Post-operative plan: The patient will be NWB in sling.  The patient will be will be discharged from PACU if continues to be stable as was plan prior to surgery.  DVT prophylaxis Aspirin 81 mg twice daily for 6 weeks.  Pain control with PRN pain medication preferring oral medicines.  Follow up plan will be scheduled in approximately 7 days for incision check and XR.  Physical therapy to start after 1 month.  Implants: Tornier revive size 11 partially coated distal stem, 30 spacer, 11 proximal body, 0 high offset tray, 6 mm polyethylene, 36+0 glenosphere, 25 standard baseplate with a 35 center screw  Post-Op Diagnosis: Same Surgeons:Primary: Hiram Gash, MD Assistants:Caroline McBane PA-C Location: [facility] OR ROOM 6 Anesthesia: General with Exparel Interscalene Antibiotics: Ancef 2g preop, Vancomycin 1074m locally Tourniquet time: None Estimated Blood Loss: 1923Complications: None Specimens: None Implants: Implant Name Type Inv. Item Serial No. Manufacturer Lot No. LRB No. Used Action  BASEPLATE GLENOSPHERE 230QTSTD - [id number] BASEPLATE GLENOSPHERE 262BWSTD [id number] [employer]  Left 1 Implanted  GLENOSPHERE REV SHOULDER 36 - [id number] GLENOSPHERE REV SHOULDER 36 [id number] [employer]ER [employer]  Left 1  Implanted  AEQUALIS FLEX REVIVE SPACER   [id number]  Left 1 Implanted  AEQUALIS FLEX REVIVE PARTIALLY COATED DISTAL STEM   [id number]  Left 1 Implanted  CAP LOCKING COCR - [id number] CAP LOCKING COCR [id number] INC  Left 1 Implanted  AEQUALIS FLEX REVIVE PTC PROXIMAL BODY   [id number] INC  Left 1 Implanted  IMPLANT REVERSE SHOULDER 0X3.5 - [id number] IMPLANT REVERSE SHOULDER 0X3.5 [id number] INC  Left 1 Implanted  INSERT HUMERAL 36X6MM 12.5DEG - [id number] INSERT HUMERAL 36X6MM 12.5DEG [id number] [employer]  Left 1 Implanted  BONE SCREW THREAD 6.5X35MM - [id number] BONE SCREW THREAD 6.5X35MM  [employer]ER [employer] ON STERILE TRAY Left 1 Implanted  SCREW 5.0X38 SMALL F/PERFORM - [id number] SCREW 5.0X38 SMALL F/PERFORM  [employer] ON STERILE TRAY Left 1 Implanted  SCREW 5.5X26 - [id number] SCREW 5.5X26  TOR[employer]ER [employer] ON STERILE TRAY Left 1 Implanted  AEUALIS FELX REVIVE ASSEMBLY SCREW   [id number] INC  Left 1 Implanted    Indications for Surgery:   Janet Schlichtingis a 55y.o. female with fracture about a week to 10 days ago resulting in a displaced proximal humerus injury.  Unfortunately based on the patient's history of 1 pack/day smoking and the relative delay obtaining surgical evaluation the patient had significant proximal bone loss in the humeral head.  Additionally there was an area of head split.  Unfortunately the patient was indicated for arthroplasty instead of open reduction internal fixation which was worrisome based on her young age.  Benefits and risks of operative and nonoperative management were discussed prior to surgery with patient/guardian(s) and informed consent form was  completed.  Infection and need for further surgery were discussed as was prosthetic stability and cuff issues.  We additionally specifically discussed risks of axillary nerve injury, infection, periprosthetic fracture, continued pain and longevity of  implants prior to beginning procedure.      Procedure:   The patient was identified in the preoperative holding area where the surgical site was marked. Block placed by anesthesia with exparel.  The patient was taken to the OR where a procedural timeout was called and the above noted anesthesia was induced.  The patient was positioned beachchair on allen table with spider arm positioner.  Preoperative antibiotics were dosed.  The patient's left shoulder was prepped and draped in the usual sterile fashion.  A second preoperative timeout was called.       Standard deltopectoral approach was performed with a #10 blade. We dissected down to the subcutaneous tissues and the cephalic vein was taken laterally with the deltoid. Clavipectoral fascia was incised in line with the incision. Deep retractors were placed. The long of the biceps tendon was identified and there was significant tenosynovitis present.  Tenodesis was performed to the pectoralis tendon with #2 Ethibond. The remaining biceps was followed up into the rotator interval where it was released.    We used the bicipital groove as a landmark for the lesser and greater tuberosity fragments.  We were able to mobilize the lesser tuberosity fragment and placed stay sutures in the bone tendon junction to help with mobilization.  This point we were able to identify the  greater tuberosity fragment and 4 #5  FiberWire sutures were used to place into this for eventual repair of the tuberosities.  Once these were both mobilized we took care to identify the shaft fragment as well as the head fragment.  We carefully identified the head fragment were able to manually remove it.  At this point the axillary nerve was found and palpated and with a tug test noted to be intact.  Protected throughout the remainder of the case with blunt retractors.    We then released the SGHL with bovie cautery prior to placing a curved mayo at the junction of the anterior glenoid  well above the axillary nerve and bluntly dissecting the subscapularis from the capsule.  We then carefully protected the axillary nerve as we gently released the inferior capsule to fully mobilize the subscapularis.  An anterior deltoid retractor was then placed as well as a small Hohmann retractor superiorly.   The glenoid was relatively preserved as we would expect in this fracture patient.  The remaining labrum was removed circumferentially taking great care not to disrupt the posterior capsule.    The glenoid drill guide was placed and used to drill a guide pin in the center, inferior position. The glenoid face was then reamed concentrically over the guide wire. The center hole was drilled over the guidepin in a near anatomic angle of version. Next the glenoid vault was drilled back to a depth of 35 mm.  We tapped and then placed a 42m size baseplate with 0 lateralization was selected with a 6.5 mm x 341mlength central screw.  The base plate was screwed into the glenoid vault obtaining secure fixation. We next placed superior and inferior locking screws for additional fixation.  Next a 36 mm glenosphere was selected and impacted onto the baseplate. The center screw was tightened.  We attempted to initially use a flex stem however it was clear that the fracture split in the  proximal shaft was too extensive to safely use this type of stem.  We instead used the revive system.   We then repositioned the arm to give access to the humeral shaft fragment.  Drill holes were placed and fiberwire sutures in the shaft for vertical fixation of the tuberosities.  We broached with the Revive stem implants  starting with a size 9 reamer and reaming up to 11 which obtained an appropriate fit.  The proximal body was sized separately and attached to trial and achieve a stable articulation.    We trialed with multiple size tray and polyethylene options and selected a 0 high which provided good stability and range of  motion without excess soft tissue tension. The offset was dialed in to match the normal anatomy. The shoulder was trialed.  There was good ROM in all planes and the shoulder was stable with no inferior translation.   We then mobilized her tuberosities again and placed the anterior deep limbs of the 4 #5 fiber wires around the stem.  1 of these was tied down fixing the greater tuberosity in place after bone graft harvest from the humeral head component was placed underneath.  A +0 high offset tray was selected and impacted onto the stem.   A 36+6 polyethylene liner was impacted onto the stem.  The joint was reduced and thoroughly irrigated with pulsatile lavage. The remaining sutures were then placed through the subscapularis and the bone tendon junction and the tuberosities were reduced after bone graft placed beneath as autograft at the subscap.  We horizontally secured the tuberosities before placing vertical fixation with the suture that was placed into the shaft.  We performed an open reduction internal fixation of the tuberosities using the suture based configuration.  Tuberosities moved as a unit were happy with her overall reduction.  This was checked on fluoroscopy confirming our position.  We irrigated copiously at this point.  Hemostasis was obtained. The deltopectoral interval was reapproximated with #1 Ethibond. The subcutaneous tissues were closed with 3-0 Vicryl and the skin was closed with running monocryl.     The wounds were cleaned and dried and an Aquacel dressing was placed. The drapes taken down. The arm was placed into sling with abduction pillow. Patient was awakened, extubated, and transferred to the recovery room in stable condition. There were no intraoperative complications. The sponge, needle, and attention counts were correct at the end of the case.     Noemi Chapel, PA-C, present and scrubbed throughout the case, critical for completion in a timely fashion, and for  retraction, instrumentation, closure.

## 2021-05-13 NOTE — Interval H&P Note (Signed)
All questions answered, patient wants to proceed with procedure. ? ?

## 2021-05-13 NOTE — Progress Notes (Signed)
Assisted Dr. Miller with left, ultrasound guided, interscalene  block. Side rails up, monitors on throughout procedure. See vital signs in flow sheet. Tolerated Procedure well.  

## 2021-05-13 NOTE — Anesthesia Postprocedure Evaluation (Signed)
Anesthesia Post Note  Patient: Janet Harper  Procedure(s) Performed: REVERSE SHOULDER ARTHROPLASTY (Left: Shoulder)     Patient location during evaluation: PACU Anesthesia Type: General Level of consciousness: awake and alert Pain management: pain level controlled Vital Signs Assessment: post-procedure vital signs reviewed and stable Respiratory status: spontaneous breathing, nonlabored ventilation and respiratory function stable Cardiovascular status: blood pressure returned to baseline and stable Postop Assessment: no apparent nausea or vomiting Anesthetic complications: no   No notable events documented.  Last Vitals:  Vitals:   05/13/21 1130 05/13/21 1228  BP: (P) 132/72 132/72  Pulse: (P) 72 72  Resp: (P) 20 20  Temp: (P) 36.7 C 36.7 C  SpO2: (P) 93% 93%    Last Pain:  Vitals:   05/13/21 1228  TempSrc:   PainSc: 2                  Lowella Curb

## 2021-05-13 NOTE — Transfer of Care (Signed)
Immediate Anesthesia Transfer of Care Note  Patient: Janet Harper  Procedure(s) Performed: REVERSE SHOULDER ARTHROPLASTY (Left: Shoulder)  Patient Location: PACU  Anesthesia Type:GA combined with regional for post-op pain  Level of Consciousness: awake, alert  and oriented  Airway & Oxygen Therapy: Patient Spontanous Breathing and Patient connected to face mask oxygen  Post-op Assessment: Report given to RN and Post -op Vital signs reviewed and stable  Post vital signs: Reviewed and stable  Last Vitals:  Vitals Value Taken Time  BP 92/63 05/13/21 1100  Temp    Pulse 82 05/13/21 1103  Resp 20 05/13/21 1103  SpO2 100 % 05/13/21 1103  Vitals shown include unvalidated device data.  Last Pain:  Vitals:   05/13/21 0826  TempSrc: Oral  PainSc: 2          Complications: No notable events documented.

## 2021-05-14 ENCOUNTER — Encounter (HOSPITAL_BASED_OUTPATIENT_CLINIC_OR_DEPARTMENT_OTHER): Payer: Self-pay | Admitting: Orthopaedic Surgery

## 2021-06-17 ENCOUNTER — Other Ambulatory Visit: Payer: Self-pay

## 2021-06-17 ENCOUNTER — Ambulatory Visit (INDEPENDENT_AMBULATORY_CARE_PROVIDER_SITE_OTHER): Payer: BC Managed Care – PPO | Admitting: Physical Therapy

## 2021-06-17 DIAGNOSIS — M25512 Pain in left shoulder: Secondary | ICD-10-CM

## 2021-06-17 DIAGNOSIS — M6281 Muscle weakness (generalized): Secondary | ICD-10-CM

## 2021-06-17 DIAGNOSIS — R293 Abnormal posture: Secondary | ICD-10-CM | POA: Diagnosis not present

## 2021-06-17 DIAGNOSIS — M25612 Stiffness of left shoulder, not elsewhere classified: Secondary | ICD-10-CM | POA: Diagnosis not present

## 2021-06-17 NOTE — Therapy (Signed)
[facility] [place] [address] [place], [place], [address] Phone: [phone]   Fax:  [phone]  Physical Therapy Evaluation  Patient Details  Name: Janet Harper MRN: [id number] Date of Birth: [birth year] Referring Provider (PT): Ramond Marrow MD   Encounter Date: 06/17/2021   PT End of Session - 06/17/21 0811     Visit Number 1    Number of Visits 24    Date for PT Re-Evaluation 09/09/21    Authorization Type BCBS    PT Start Time [phone]   late arrival   PT Stop Time 0845    PT Time Calculation (min) 34 min    Activity Tolerance Patient tolerated treatment well    Behavior During Therapy [facility] for tasks assessed/performed             Past Medical History:  Diagnosis Date   Anxiety    Asthma due to seasonal allergies    COPD (chronic obstructive pulmonary disease) (HCC)    Depression    Smoker    smokes 1ppd    Past Surgical History:  Procedure Laterality Date   ABLATION     REVERSE SHOULDER ARTHROPLASTY Left 05/13/2021   Procedure: REVERSE SHOULDER ARTHROPLASTY;  Surgeon: Bjorn Pippin, MD;  Location: [facility];  Service: Orthopedics;  Laterality: Left;   TIBIA FRACTURE SURGERY Right     There were no vitals filed for this visit.    Subjective Assessment - 06/17/21 0816     Subjective Pt reports hurting her shoulder trying to break a fall while holding her chihuhua. Pt had surgery 05/13/21. Pt reports her shoulder was to be completely immobile for the first 4 weeks. Got sling off last week. Has started to work on her hand and wrist. Pt states she's been trying to lift/stretch out her shoulder. Pt reports she has a 5lb lifting restriction.    Pertinent History Had prior R shoulder fx    Limitations Lifting;House hold activities;Writing    How long can you sit comfortably? n/a    How long can you stand comfortably? n/a    How long can you walk comfortably? n/a    Diagnostic tests n/a    Patient  Stated Goals Improve shoulder movement to PLOF    Currently in Pain? Yes    Pain Score 4     Pain Location Shoulder    Pain Orientation Left    Pain Descriptors / Indicators Throbbing    Pain Radiating Towards Around incision    Aggravating Factors  Keeping arm up for long periods    Pain Relieving Factors None                [facility] PT Assessment - 06/17/21 0001       Assessment   Medical Diagnosis L reverse TSA (5 weeks post op)    Referring Provider (PT) Ramond Marrow MD    Onset Date/Surgical Date 05/13/21    Hand Dominance Right    Prior Therapy R shoulder fx      Precautions   Precautions Shoulder    Type of Shoulder Precautions No IR/Ext x 8 weeks    Precaution Comments Phase 1 (1-4 weeks): ROM, AAROM to AROM as tolerated. Gentle ER <30 deg. Cane and pulleys okay if advancing from PROM. ROM by week 2 90 deg flex, 15 deg ER. Goal week 4 120 deg flex, 30 deg ER. Grip, iso below shoulder level      Restrictions   Weight  Bearing Restrictions Yes    Other Position/Activity Restrictions 5 lb weight limit      Balance Screen   Has the patient fallen in the past 6 months Yes    How many times? 1    Has the patient had a decrease in activity level because of a fear of falling?  Yes    Is the patient reluctant to leave their home because of a fear of falling?  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      Observation/Other Assessments   Focus on Therapeutic Outcomes (FOTO)  50 (risk adjusted 37); predicted 70      Posture/Postural Control   Posture/Postural Control Postural limitations    Postural Limitations Rounded Shoulders;Forward head      ROM / Strength   AROM / PROM / Strength AROM;PROM      AROM   AROM  Assessment Site Shoulder    Right/Left Shoulder Left    Left Shoulder Extension --   avoid due to precautions   Left Shoulder Flexion 68 Degrees    Left Shoulder ABduction 55 Degrees    Left Shoulder Internal Rotation --   avoid due to precautions   Left Shoulder External Rotation 5 Degrees      PROM   PROM Assessment Site Shoulder    Right/Left Shoulder Left    Left Shoulder Flexion 80 Degrees    Left Shoulder ABduction 65 Degrees    Left Shoulder External Rotation 8 Degrees      Palpation   Palpation comment TTP and taut around biceps, pecs, anterior UT and scalenes                        Objective measurements completed on examination: See above findings.       [facility] Adult PT Treatment/Exercise - 06/17/21 0001       Self-Care   Self-Care Other Self-Care Comments;Heat/Ice Application    Heat/Ice Application Discussed using ice even though pt does not like it -- especially after her PT sessions    Other Self-Care Comments  Discussed massaging scar                     PT Education - 06/17/21 0850     Education Details Exam findings, POC, and initial HEP    Person(s) Educated Patient    Methods Explanation;Demonstration;Tactile cues;Verbal cues;Handout    Comprehension Verbalized understanding;Returned demonstration;Verbal cues required;Tactile cues required;Need further instruction              PT Short Term Goals - 06/17/21 0908       PT SHORT TERM GOAL #1   Title Pt will be independent with initial HEP    Time 4    Period Weeks    Status New    Target Date 07/15/21      PT SHORT TERM GOAL #2   Title Pt will be able to perform shoulder flexion and abd AROM to at least 90 deg    Time 4    Period Weeks    Status New    Target Date 07/15/21      PT SHORT TERM GOAL #  3   Title Pt will report decrease in pain by at least 25%    Time 4    Period Weeks    Status New    Target Date 07/15/21               PT Long Term  Goals - 06/17/21 0913       PT LONG TERM GOAL #1   Title Pt will be independent with advanced HEP for strengthening    Time 12    Period Weeks    Status New    Target Date 09/09/21      PT LONG TERM GOAL #2   Title Pt will improve shoulder AROM to at least 140 deg in flexion and abd for overhead activities    Time 12    Period Weeks    Status New    Target Date 09/09/21      PT LONG TERM GOAL #3   Title Pt will be able to lift at least 5 lbs over head to demo increased functional strength    Time 12    Period Weeks    Status New    Target Date 09/09/21      PT LONG TERM GOAL #4   Title Pt will demo at least 4/5 shoulder strength in all directions    Time 12    Period Weeks    Status New    Target Date 09/09/21      PT LONG TERM GOAL #5   Title Pt will have improved FOTO score to at least 70    Baseline 50    Time 12    Period Weeks    Status New    Target Date 09/09/21      Additional Long Term Goals   Additional Long Term Goals Yes      PT LONG TERM GOAL #6   Title **After 09/02/21** Pt will be able to reach behind her back for dressing and bathing    Baseline Limited due to shoulder precautions    Time 12    Period Weeks    Status New    Target Date 09/09/21                    Plan - 06/17/21 0850     Clinical Impression Statement Ms. Janet Harper is a 55 y/o F presenting to OPPT s/p L reverse TSA on 05/13/2021. She is currently 5 weeks post-op. Pt is to avoid shoulder IR and extension for an additional 8 weeks. She is cleared for 5 lbs of weight bearing/lifting. On assessment, pt demos pain, decreased shoulder AROM/PROM, and strength. Pt's incision site remains tender and has taut UTs, pecs, and biceps. Pt would greatly benefit from PT to improve her shoulder motion for any overhead activity and return to PLOF.    Personal Factors and Comorbidities Age;Time since onset of injury/illness/exacerbation;Profession    Examination-Activity Limitations Reach  Overhead;Caring for Others;Carry;Toileting;Hygiene/Grooming;Dressing;Lift    Examination-Participation Restrictions Cleaning;Meal Prep;Occupation;Shop;Driving;Community Activity;Laundry;Yard Work    Stability/Clinical Decision Making Stable/Uncomplicated    Optometrist Low    Rehab Potential Good    PT Frequency 2x / week    PT Duration 8 weeks    PT Treatment/Interventions ADLs/Self Care Home Management;Aquatic Therapy;Cryotherapy;Electrical Stimulation;Iontophoresis /ml Dexamethasone;Moist Heat;Gait training;Stair training;Functional mobility training;Therapeutic activities;Therapeutic exercise;Balance training;Neuromuscular re-education;Manual techniques;Patient/family education;Dry needling;Passive range of motion;Scar mobilization;Taping    PT Next Visit Plan Assess response to HEP. Provide PROM and AAROM. Neck stretches. Manual  therapy as indicated. Progress according to protocol (see file folder)    PT Home Exercise Plan Access Code: [email or web address]    Consulted and Agree with Plan of Care Patient             Patient will benefit from skilled therapeutic intervention in order to improve the following deficits and impairments:  Decreased range of motion, Increased fascial restricitons, Increased muscle spasms, Impaired UE functional use, Decreased endurance, Decreased activity tolerance, Pain, Improper body mechanics, Decreased scar mobility, Decreased mobility, Decreased strength, Increased edema, Postural dysfunction, Hypomobility  Visit Diagnosis: Acute pain of left shoulder  Stiffness of left shoulder, not elsewhere classified  Muscle weakness (generalized)  Abnormal posture     Problem List Patient Active Problem List   Diagnosis Date Noted   Fracture of humerus, proximal, right, closed 02/26/2020    [facility] April Ma L Aristotelis Vilardi, PT, DPT 06/17/2021, 9:18 AM  [facility] [address] [place],  [place], [address] Phone: [phone]   Fax:  [phone]  Name: Janet Harper MRN: [id number] Date of Birth: 10/03/1965

## 2021-06-30 ENCOUNTER — Ambulatory Visit: Payer: BC Managed Care – PPO | Attending: Orthopaedic Surgery | Admitting: Physical Therapy

## 2021-06-30 ENCOUNTER — Other Ambulatory Visit: Payer: Self-pay

## 2021-06-30 DIAGNOSIS — M25512 Pain in left shoulder: Secondary | ICD-10-CM | POA: Insufficient documentation

## 2021-06-30 DIAGNOSIS — M6281 Muscle weakness (generalized): Secondary | ICD-10-CM | POA: Insufficient documentation

## 2021-06-30 DIAGNOSIS — M25612 Stiffness of left shoulder, not elsewhere classified: Secondary | ICD-10-CM

## 2021-06-30 DIAGNOSIS — R600 Localized edema: Secondary | ICD-10-CM | POA: Diagnosis not present

## 2021-06-30 NOTE — Therapy (Signed)
[facility] [place] [address] [place], [place], [address] Phone: [phone]   Fax:  [phone]  Physical Therapy Treatment  Patient Details  Name: Janet Harper MRN: [id number] Date of Birth: Sep 04, 1965 Referring Provider (PT): Ramond Marrow MD   Encounter Date: 06/30/2021   PT End of Session - 06/30/21 0833     Visit Number 2    Number of Visits 24    Date for PT Re-Evaluation 09/09/21    PT Start Time 0800    PT Stop Time 0845    PT Time Calculation (min) 45 min    Activity Tolerance Patient tolerated treatment well    Behavior During Therapy [facility] for tasks assessed/performed             Past Medical History:  Diagnosis Date   Anxiety    Asthma due to seasonal allergies    COPD (chronic obstructive pulmonary disease) (HCC)    Depression    Smoker    smokes 1ppd    Past Surgical History:  Procedure Laterality Date   ABLATION     REVERSE SHOULDER ARTHROPLASTY Left 05/13/2021   Procedure: REVERSE SHOULDER ARTHROPLASTY;  Surgeon: Bjorn Pippin, MD;  Location: [facility];  Service: Orthopedics;  Laterality: Left;   TIBIA FRACTURE SURGERY Right     There were no vitals filed for this visit.   Subjective Assessment - 06/30/21 0803     Subjective Pt states she is sore today "maybe because of the rain"    Patient Stated Goals Improve shoulder movement to PLOF    Currently in Pain? Yes    Pain Score 5     Pain Location Shoulder    Pain Orientation Left    Pain Descriptors / Indicators Sore    Pain Type Surgical pain                [facility] PT Assessment - 06/30/21 0001       Assessment   Medical Diagnosis L reverse TSA (5 weeks post op)    Referring Provider (PT) Ramond Marrow MD    Onset Date/Surgical Date 05/13/21    Hand Dominance Right    Prior Therapy R shoulder fx      Precautions   Precaution Comments phase 2 (weeks 4-12)      Restrictions   Other Position/Activity Restrictions  5 lb weight limit      PROM   Left Shoulder Flexion 103 Degrees    Left Shoulder ABduction 84 Degrees    Left Shoulder External Rotation 13 Degrees                           [facility] Adult PT Treatment/Exercise - 06/30/21 0001       Exercises   Exercises Shoulder      Shoulder Exercises: Seated   Other Seated Exercises scap squeeze x 10      Shoulder Exercises: Pulleys   Flexion 2 minutes    Flexion Limitations for warm up      Shoulder Exercises: Isometric Strengthening   Flexion Limitations reactive isometrics 10x red TB    External Rotation Limitations reactive isometrics x 10 red TB    ABduction Limitations reactive isometrics 10x red TB      Modalities   Modalities Vasopneumatic      Vasopneumatic   Number Minutes Vasopneumatic  10 minutes    Vasopnuematic Location  Shoulder    Vasopneumatic Pressure Low  Vasopneumatic Temperature  34      Manual Therapy   Manual Therapy Passive ROM    Passive ROM Lt shoulder flexion, abd and ER to tolerance and tissue limits                       PT Short Term Goals - 06/17/21 0908       PT SHORT TERM GOAL #1   Title Pt will be independent with initial HEP    Time 4    Period Weeks    Status New    Target Date 07/15/21      PT SHORT TERM GOAL #2   Title Pt will be able to perform shoulder flexion and abd AROM to at least 90 deg    Time 4    Period Weeks    Status New    Target Date 07/15/21      PT SHORT TERM GOAL #3   Title Pt will report decrease in pain by at least 25%    Time 4    Period Weeks    Status New    Target Date 07/15/21               PT Long Term Goals - 06/17/21 0913       PT LONG TERM GOAL #1   Title Pt will be independent with advanced HEP for strengthening    Time 12    Period Weeks    Status New    Target Date 09/09/21      PT LONG TERM GOAL #2   Title Pt will improve shoulder AROM to at least 140 deg in flexion and abd for overhead activities     Time 12    Period Weeks    Status New    Target Date 09/09/21      PT LONG TERM GOAL #3   Title Pt will be able to lift at least 5 lbs over head to demo increased functional strength    Time 12    Period Weeks    Status New    Target Date 09/09/21      PT LONG TERM GOAL #4   Title Pt will demo at least 4/5 shoulder strength in all directions    Time 12    Period Weeks    Status New    Target Date 09/09/21      PT LONG TERM GOAL #5   Title Pt will have improved FOTO score to at least 70    Baseline 50    Time 12    Period Weeks    Status New    Target Date 09/09/21      Additional Long Term Goals   Additional Long Term Goals Yes      PT LONG TERM GOAL #6   Title **After 09/02/21** Pt will be able to reach behind her back for dressing and bathing    Baseline Limited due to shoulder precautions    Time 12    Period Weeks    Status New    Target Date 09/09/21                   Plan - 06/30/21 0834     Clinical Impression Statement Pt with good tolerance to reactive isometrics for flexion and abduction, some increased difficulty with reactive ER.  Pt still with decreased ER ROM but improving flexion and abduction ROM    PT Next Visit Plan  progress HEP, ROM and strength per protocol    PT Home Exercise Plan Access Code: [email or web address]    Consulted and Agree with Plan of Care Patient             Patient will benefit from skilled therapeutic intervention in order to improve the following deficits and impairments:     Visit Diagnosis: Acute pain of left shoulder  Stiffness of left shoulder, not elsewhere classified  Muscle weakness (generalized)  Localized edema     Problem List Patient Active Problem List   Diagnosis Date Noted   Fracture of humerus, proximal, right, closed 02/26/2020    Radha Coggins, PT 06/30/2021, 8:36 AM  [facility] [address] [place], [place], [address] Phone:  [phone]   Fax:  [phone]  Name: Janet Harper MRN: [id number] Date of Birth: 10/31/1965

## 2021-07-02 ENCOUNTER — Ambulatory Visit: Payer: BC Managed Care – PPO | Admitting: Physical Therapy

## 2021-07-02 ENCOUNTER — Other Ambulatory Visit: Payer: Self-pay

## 2021-07-02 DIAGNOSIS — M6281 Muscle weakness (generalized): Secondary | ICD-10-CM

## 2021-07-02 DIAGNOSIS — M25512 Pain in left shoulder: Secondary | ICD-10-CM

## 2021-07-02 DIAGNOSIS — R600 Localized edema: Secondary | ICD-10-CM

## 2021-07-02 DIAGNOSIS — M25612 Stiffness of left shoulder, not elsewhere classified: Secondary | ICD-10-CM

## 2021-07-02 NOTE — Therapy (Signed)
[facility] [place] [address] [place], [place], [address] Phone: [phone]   Fax:  [phone]  Physical Therapy Treatment  Patient Details  Name: Janet AverJill Harper MRN: [id number] Date of Birth: 11/15/1965 Referring Provider (PT): Ramond MarrowVarkey, Dax MD   Encounter Date: 07/02/2021   PT End of Session - 07/02/21 0833     Visit Number 3    Number of Visits 24    Date for PT Re-Evaluation 09/09/21    Authorization Type BC[employer]    PT Start Time 0800    PT Stop Time 0844    PT Time Calculation (min) 44 min    Activity Tolerance Patient tolerated treatment well    Behavior [facility] Therapy [facility] for tasks assessed/performed             Past Medical History:  Diagnosis Date   Anxiety    Asthma due to seasonal allergies    COPD (chronic obstructive pulmonary disease) (HCC)    Depression    Smoker    smokes 1ppd    Past Surgical History:  Procedure Laterality Date   ABLATION     REVERSE SHOULDER ARTHROPLASTY Left 05/13/2021   Procedure: REVERSE SHOULDER ARTHROPLASTY;  Surgeon: Bjorn PippinVarkey, Dax T, MD;  Location: [facility];  Service: Orthopedics;  Laterality: Left;   TIBIA FRACTURE SURGERY Right     There were no vitals filed for this visit.   Subjective Assessment - 07/02/21 0802     Subjective Pt felt good on wednesday after session but was sore yesterday    Patient Stated Goals Improve shoulder movement to PLOF    Currently in Pain? Yes    Pain Score 2     Pain Location Shoulder    Pain Orientation Left    Pain Descriptors / Indicators Sore    Pain Type Surgical pain                               [facility] Adult PT Treatment/Exercise - 07/02/21 0001       Shoulder Exercises: Supine   External Rotation AAROM;10 reps    External Rotation Limitations in scapular plane    Flexion AAROM;10 reps      Shoulder Exercises: Seated   Other Seated Exercises scap squeeze x 10      Shoulder Exercises:  Pulleys   Flexion 2 minutes    Flexion Limitations for warm up      Shoulder Exercises: Isometric Strengthening   Flexion Limitations reactive isometrics 10x red TB    External Rotation Limitations reactive isometric yellow TB x 5    ABduction Limitations reactive isometrics 10x red TB      Vasopneumatic   Number Minutes Vasopneumatic  10 minutes    Vasopnuematic Location  Shoulder    Vasopneumatic Pressure Low    Vasopneumatic Temperature  34      Manual Therapy   Manual Therapy Passive ROM;Soft tissue mobilization    Soft tissue mobilization Lt bicep and pecs to decrease pain and improve mobility    Passive ROM Lt shoulder flexion, abd and ER to tolerance and tissue limits                       PT Short Term Goals - 06/17/21 0908       PT SHORT TERM GOAL #1   Title Pt will be independent with initial HEP    Time 4  Period Weeks    Status New    Target Date 07/15/21      PT SHORT TERM GOAL #2   Title Pt will be able to perform shoulder flexion and abd AROM to at least 90 deg    Time 4    Period Weeks    Status New    Target Date 07/15/21      PT SHORT TERM GOAL #3   Title Pt will report decrease in pain by at least 25%    Time 4    Period Weeks    Status New    Target Date 07/15/21               PT Long Term Goals - 06/17/21 0913       PT LONG TERM GOAL #1   Title Pt will be independent with advanced HEP for strengthening    Time 12    Period Weeks    Status New    Target Date 09/09/21      PT LONG TERM GOAL #2   Title Pt will improve shoulder AROM to at least 140 deg in flexion and abd for overhead activities    Time 12    Period Weeks    Status New    Target Date 09/09/21      PT LONG TERM GOAL #3   Title Pt will be able to lift at least 5 lbs over head to demo increased functional strength    Time 12    Period Weeks    Status New    Target Date 09/09/21      PT LONG TERM GOAL #4   Title Pt will demo at least 4/5 shoulder  strength in all directions    Time 12    Period Weeks    Status New    Target Date 09/09/21      PT LONG TERM GOAL #5   Title Pt will have improved FOTO score to at least 70    Baseline 50    Time 12    Period Weeks    Status New    Target Date 09/09/21      Additional Long Term Goals   Additional Long Term Goals Yes      PT LONG TERM GOAL #6   Title **After 09/02/21** Pt will be able to reach behind her back for dressing and bathing    Baseline Limited due to shoulder precautions    Time 12    Period Weeks    Status New    Target Date 09/09/21                   Plan - 07/02/21 0834     Clinical Impression Statement Pt continues with the most difficulty with ER ROM and strengthening, much improved tolerance to flexion and abduction    PT Next Visit Plan progress HEP, ROM and strength per protocol    PT Home Exercise Plan Access Code: [email or web address]    Consulted and Agree with Plan of Care Patient             Patient will benefit from skilled therapeutic intervention in order to improve the following deficits and impairments:     Visit Diagnosis: Acute pain of left shoulder  Stiffness of left shoulder, not elsewhere classified  Muscle weakness (generalized)  Localized edema     Problem List Patient Active Problem List   Diagnosis Date Noted   Fracture of humerus, proximal,  right, closed 02/26/2020    Janet Harper, PT 07/02/2021, 8:36 AM  [facility] [address] [place], [place], [address] Phone: [phone]   Fax:  [phone]  Name: Janet Harper MRN: [id number] Date of Birth: 04-08-1966

## 2021-07-05 ENCOUNTER — Other Ambulatory Visit: Payer: Self-pay

## 2021-07-05 ENCOUNTER — Ambulatory Visit: Payer: BC Managed Care – PPO | Admitting: Physical Therapy

## 2021-07-05 DIAGNOSIS — M25512 Pain in left shoulder: Secondary | ICD-10-CM | POA: Diagnosis not present

## 2021-07-05 DIAGNOSIS — R600 Localized edema: Secondary | ICD-10-CM

## 2021-07-05 DIAGNOSIS — M25612 Stiffness of left shoulder, not elsewhere classified: Secondary | ICD-10-CM

## 2021-07-05 DIAGNOSIS — M6281 Muscle weakness (generalized): Secondary | ICD-10-CM

## 2021-07-05 DIAGNOSIS — R293 Abnormal posture: Secondary | ICD-10-CM

## 2021-07-05 NOTE — Therapy (Signed)
[facility] [place] [address] [place], [place], [address] Phone: [phone]   Fax:  [phone]  Physical Therapy Treatment  Patient Details  Name: Janet Harper MRN: [id number] Date of Birth: 1965/08/14 Referring Provider (PT): Ramond Marrow MD   Encounter Date: 07/05/2021    Past Medical History:  Diagnosis Date   Anxiety    Asthma due to seasonal allergies    COPD (chronic obstructive pulmonary disease) (HCC)    Depression    Smoker    smokes 1ppd    Past Surgical History:  Procedure Laterality Date   ABLATION     REVERSE SHOULDER ARTHROPLASTY Left 05/13/2021   Procedure: REVERSE SHOULDER ARTHROPLASTY;  Surgeon: Bjorn Pippin, MD;  Location: [facility];  Service: Orthopedics;  Laterality: Left;   TIBIA FRACTURE SURGERY Right     There were no vitals filed for this visit.   Subjective Assessment - 07/05/21 0806     Subjective "I'm feeling stiff. I think I might have slept on it wrong."    Pertinent History Had prior R shoulder fx    Limitations Lifting;House hold activities;Writing    How long can you sit comfortably? n/a    How long can you stand comfortably? n/a    How long can you walk comfortably? n/a    Diagnostic tests n/a    Patient Stated Goals Improve shoulder movement to PLOF    Currently in Pain? Yes    Pain Score 6     Pain Location Shoulder    Pain Orientation Left    Pain Descriptors / Indicators Sore    Pain Type Surgical pain                [facility] PT Assessment - 07/05/21 0001       Assessment   Medical Diagnosis L reverse TSA (5 weeks post op)    Referring Provider (PT) Ramond Marrow MD    Onset Date/Surgical Date 05/13/21    Hand Dominance Right      Precautions   Type of Shoulder Precautions No IR/Ext x3 months    Precaution Comments phase 2 (weeks 4-12); okay to begin resisted flex, ER, abd as able      Restrictions   Other Position/Activity Restrictions 5 lb  weight limit      PROM   Left Shoulder Flexion 110 Degrees   supine   Left Shoulder ABduction 78 Degrees    Left Shoulder External Rotation 15 Degrees                           [facility] Adult PT Treatment/Exercise - 07/05/21 0001       Self-Care   Other Self-Care Comments  Discussed self massage of pecs and anterior shoulder with ball      Shoulder Exercises: Supine   External Rotation AAROM;10 reps    External Rotation Limitations in scapular plane    Flexion AAROM;10 reps    ABduction AAROM;10 reps      Shoulder Exercises: Standing   Row Strengthening;5 reps    Row Limitations reactive isometrics      Shoulder Exercises: Pulleys   Flexion 1 minute    Scaption 1 minute      Shoulder Exercises: Isometric Strengthening   External Rotation Limitations reactive isometric red TB x 5    ABduction Limitations 3x10 sec against door      Vasopneumatic   Number Minutes Vasopneumatic  10 minutes  Vasopnuematic Location  Shoulder    Vasopneumatic Pressure Low    Vasopneumatic Temperature  34      Manual Therapy   Soft tissue mobilization Lt bicep and pecs to decrease pain and improve mobility    Passive ROM Lt shoulder flexion, abd and ER to tolerance and tissue limits                       PT Short Term Goals - 06/17/21 0908       PT SHORT TERM GOAL #1   Title Pt will be independent with initial HEP    Time 4    Period Weeks    Status New    Target Date 07/15/21      PT SHORT TERM GOAL #2   Title Pt will be able to perform shoulder flexion and abd AROM to at least 90 deg    Time 4    Period Weeks    Status New    Target Date 07/15/21      PT SHORT TERM GOAL #3   Title Pt will report decrease in pain by at least 25%    Time 4    Period Weeks    Status New    Target Date 07/15/21               PT Long Term Goals - 06/17/21 0913       PT LONG TERM GOAL #1   Title Pt will be independent with advanced HEP for strengthening     Time 12    Period Weeks    Status New    Target Date 09/09/21      PT LONG TERM GOAL #2   Title Pt will improve shoulder AROM to at least 140 deg in flexion and abd for overhead activities    Time 12    Period Weeks    Status New    Target Date 09/09/21      PT LONG TERM GOAL #3   Title Pt will be able to lift at least 5 lbs over head to demo increased functional strength    Time 12    Period Weeks    Status New    Target Date 09/09/21      PT LONG TERM GOAL #4   Title Pt will demo at least 4/5 shoulder strength in all directions    Time 12    Period Weeks    Status New    Target Date 09/09/21      PT LONG TERM GOAL #5   Title Pt will have improved FOTO score to at least 70    Baseline 50    Time 12    Period Weeks    Status New    Target Date 09/09/21      Additional Long Term Goals   Additional Long Term Goals Yes      PT LONG TERM GOAL #6   Title **After 09/02/21** Pt will be able to reach behind her back for dressing and bathing    Baseline Limited due to shoulder precautions    Time 12    Period Weeks    Status New    Target Date 09/09/21                   Plan - 07/05/21 0900     Clinical Impression Statement ER remains limited. Abduction a little tighter today. Continued to work on ROM and strengthening  per protocol. Pt is currently cleared for initiating band exercises. 07/08/21 pt will be 8 weeks post op. Discussed trialing TPDN.    Personal Factors and Comorbidities Age;Time since onset of injury/illness/exacerbation;Profession    Examination-Activity Limitations Reach Overhead;Caring for Others;Carry;Toileting;Hygiene/Grooming;Dressing;Lift    Examination-Participation Restrictions Cleaning;Meal Prep;Occupation;Shop;Driving;Community Activity;Laundry;Yard Work    Stability/Clinical Decision Making Stable/Uncomplicated    Rehab Potential Good    PT Frequency 2x / week    PT Duration 8 weeks    PT Treatment/Interventions ADLs/Self Care Home  Management;Aquatic Therapy;Cryotherapy;Electrical Stimulation;Iontophoresis 4mg /ml Dexamethasone;Moist Heat;Gait training;Stair training;Functional mobility training;Therapeutic activities;Therapeutic exercise;Balance training;Neuromuscular re-education;Manual techniques;Patient/family education;Dry needling;Passive range of motion;Scar mobilization;Taping    PT Next Visit Plan progress HEP, ROM and strength per protocol    PT Home Exercise Plan Access Code: [email or web address]    Consulted and Agree with Plan of Care Patient             Patient will benefit from skilled therapeutic intervention in order to improve the following deficits and impairments:  Decreased range of motion, Increased fascial restricitons, Increased muscle spasms, Impaired UE functional use, Decreased endurance, Decreased activity tolerance, Pain, Improper body mechanics, Decreased scar mobility, Decreased mobility, Decreased strength, Increased edema, Postural dysfunction, Hypomobility  Visit Diagnosis: Acute pain of left shoulder  Stiffness of left shoulder, not elsewhere classified  Muscle weakness (generalized)  Localized edema  Abnormal posture     Problem List Patient Active Problem List   Diagnosis Date Noted   Fracture of humerus, proximal, right, closed 02/26/2020    [facility]ellen April Ma L Nayanna Seaborn, PT, DPT 07/05/2021, 9:06 AM  [facility] [address] [place], [place], [address] Phone: [phone]   Fax:  [phone]  Name: Janet AverJill Dolney MRN: [id number] Date of Birth: 06/29/1965

## 2021-07-08 ENCOUNTER — Other Ambulatory Visit: Payer: Self-pay

## 2021-07-08 ENCOUNTER — Ambulatory Visit: Payer: BC Managed Care – PPO | Admitting: Physical Therapy

## 2021-07-08 DIAGNOSIS — M25612 Stiffness of left shoulder, not elsewhere classified: Secondary | ICD-10-CM

## 2021-07-08 DIAGNOSIS — M25512 Pain in left shoulder: Secondary | ICD-10-CM | POA: Diagnosis not present

## 2021-07-08 DIAGNOSIS — M6281 Muscle weakness (generalized): Secondary | ICD-10-CM

## 2021-07-08 DIAGNOSIS — R600 Localized edema: Secondary | ICD-10-CM

## 2021-07-08 NOTE — Therapy (Signed)
[facility] [place] [address] [place], [place], [address] Phone: [phone]   Fax:  [phone]  Physical Therapy Treatment  Patient Details  Name: Janet Harper MRN: [id number] Date of Birth: 1966/04/30 Referring Provider (PT): Ramond Marrow MD   Encounter Date: 07/08/2021   PT End of Session - 07/08/21 0807     Visit Number 5    Number of Visits 24    Date for PT Re-Evaluation 09/09/21    Authorization Type BCBS    PT Start Time 0807    PT Stop Time 0845    PT Time Calculation (min) 38 min    Activity Tolerance Patient tolerated treatment well    Behavior During Therapy [facility] for tasks assessed/performed             Past Medical History:  Diagnosis Date   Anxiety    Asthma due to seasonal allergies    COPD (chronic obstructive pulmonary disease) (HCC)    Depression    Smoker    smokes 1ppd    Past Surgical History:  Procedure Laterality Date   ABLATION     REVERSE SHOULDER ARTHROPLASTY Left 05/13/2021   Procedure: REVERSE SHOULDER ARTHROPLASTY;  Surgeon: Bjorn Pippin, MD;  Location: [facility];  Service: Orthopedics;  Laterality: Left;   TIBIA FRACTURE SURGERY Right     There were no vitals filed for this visit.   Subjective Assessment - 07/08/21 0809     Subjective "The shoulder doesn't feel all that bad right now." She notes she has not been able to do her HEP.    Pertinent History Had prior R shoulder fx    Limitations Lifting;House hold activities;Writing    How long can you sit comfortably? n/a    How long can you stand comfortably? n/a    How long can you walk comfortably? n/a    Diagnostic tests n/a    Patient Stated Goals Improve shoulder movement to PLOF    Currently in Pain? 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)    Referring Provider (PT) Ramond Marrow MD    Onset Date/Surgical  Date 05/13/21    Hand Dominance Right      Precautions   Precaution Comments phase 2 (weeks 4-12); okay to begin resisted flex, ER, abd as able      Restrictions   Other Position/Activity Restrictions 5 lb weight limit                           [facility] Adult PT Treatment/Exercise - 07/08/21 0001       Shoulder Exercises: Seated   External Rotation AAROM;Left;10 reps    Flexion AAROM;Left;10 reps    Abduction AAROM;Left;10 reps      Shoulder Exercises: Sidelying   Other Sidelying Exercises scapular depression, retraction, protraction 2x10      Shoulder Exercises: Pulleys   Flexion 1 minute    Scaption 1 minute    ABduction 1 minute      Manual Therapy   Manual Therapy Passive ROM;Soft tissue mobilization    Soft tissue mobilization Lt bicep and pecs to decrease pain and improve mobility    Passive ROM Lt shoulder flexion, abd and ER to tolerance and tissue limits  PT Short Term Goals - 06/17/21 0908       PT SHORT TERM GOAL #1   Title Pt will be independent with initial HEP    Time 4    Period Weeks    Status New    Target Date 07/15/21      PT SHORT TERM GOAL #2   Title Pt will be able to perform shoulder flexion and abd AROM to at least 90 deg    Time 4    Period Weeks    Status New    Target Date 07/15/21      PT SHORT TERM GOAL #3   Title Pt will report decrease in pain by at least 25%    Time 4    Period Weeks    Status New    Target Date 07/15/21               PT Long Term Goals - 06/17/21 0913       PT LONG TERM GOAL #1   Title Pt will be independent with advanced HEP for strengthening    Time 12    Period Weeks    Status New    Target Date 09/09/21      PT LONG TERM GOAL #2   Title Pt will improve shoulder AROM to at least 140 deg in flexion and abd for overhead activities    Time 12    Period Weeks    Status New    Target Date 09/09/21      PT LONG TERM GOAL #3   Title Pt will be  able to lift at least 5 lbs over head to demo increased functional strength    Time 12    Period Weeks    Status New    Target Date 09/09/21      PT LONG TERM GOAL #4   Title Pt will demo at least 4/5 shoulder strength in all directions    Time 12    Period Weeks    Status New    Target Date 09/09/21      PT LONG TERM GOAL #5   Title Pt will have improved FOTO score to at least 70    Baseline 50    Time 12    Period Weeks    Status New    Target Date 09/09/21      Additional Long Term Goals   Additional Long Term Goals Yes      PT LONG TERM GOAL #6   Title **After 09/02/21** Pt will be able to reach behind her back for dressing and bathing    Baseline Limited due to shoulder precautions    Time 12    Period Weeks    Status New    Target Date 09/09/21                   Plan - 07/08/21 0929     Clinical Impression Statement Pt with less shoulder tightness this session. Continued to work on [employer] and manual work. Reports pain mostly along bicep. Worked on scapular mobilization/strengthening this session. Modified pt's HEP to table AAROM to hopefully improve her compliance.    Personal Factors and Comorbidities Age;Time since onset of injury/illness/exacerbation;Profession    Examination-Activity Limitations Reach Overhead;Caring for Others;Carry;Toileting;Hygiene/Grooming;Dressing;Lift    Examination-Participation Restrictions Cleaning;Meal Prep;Occupation;Shop;Driving;Community Activity;Laundry;Yard Work    Stability/Clinical Decision Making Stable/Uncomplicated    Rehab Potential Good    PT Frequency 2x / week    PT  Duration 8 weeks    PT Treatment/Interventions ADLs/Self Care Home Management;Aquatic Therapy;Cryotherapy;Electrical Stimulation;Iontophoresis 4mg /ml Dexamethasone;Moist Heat;Gait training;Stair training;Functional mobility training;Therapeutic activities;Therapeutic exercise;Balance training;Neuromuscular re-education;Manual techniques;Patient/family  education;Dry needling;Passive range of motion;Scar mobilization;Taping    PT Next Visit Plan progress HEP, ROM and strength per protocol    PT Home Exercise Plan Access Code: [email or web address]    Consulted and Agree with Plan of Care Patient             Patient will benefit from skilled therapeutic intervention in order to improve the following deficits and impairments:  Decreased range of motion, Increased fascial restricitons, Increased muscle spasms, Impaired UE functional use, Decreased endurance, Decreased activity tolerance, Pain, Improper body mechanics, Decreased scar mobility, Decreased mobility, Decreased strength, Increased edema, Postural dysfunction, Hypomobility  Visit Diagnosis: Acute pain of left shoulder  Stiffness of left shoulder, not elsewhere classified  Muscle weakness (generalized)  Localized edema     Problem List Patient Active Problem List   Diagnosis Date Noted   Fracture of humerus, proximal, right, closed 02/26/2020    [facility] Janet Harper L Nidhi Jacome, PT, DPT 07/08/2021, 9:32 AM  [facility] [address] [place], [place], [address] Phone: [phone]   Fax:  [phone]  Name: Janet AverJill Hayhurst MRN: [id number] Date of Birth: 03/26/1966

## 2021-07-12 ENCOUNTER — Other Ambulatory Visit: Payer: Self-pay

## 2021-07-12 ENCOUNTER — Ambulatory Visit: Payer: BC Managed Care – PPO | Admitting: Physical Therapy

## 2021-07-12 DIAGNOSIS — R293 Abnormal posture: Secondary | ICD-10-CM

## 2021-07-12 DIAGNOSIS — M25612 Stiffness of left shoulder, not elsewhere classified: Secondary | ICD-10-CM

## 2021-07-12 DIAGNOSIS — M25512 Pain in left shoulder: Secondary | ICD-10-CM

## 2021-07-12 DIAGNOSIS — M6281 Muscle weakness (generalized): Secondary | ICD-10-CM

## 2021-07-12 DIAGNOSIS — R600 Localized edema: Secondary | ICD-10-CM

## 2021-07-12 NOTE — Therapy (Signed)
[place], [place], [address] Phone: [phone]   Fax:  [phone]  Physical Therapy Treatment  Patient Details  Name: Janet Harper MRN: [id number] Date of Birth: 1966-06-13 Referring Provider (PT): Ophelia Charter MD   Encounter Date: 07/12/2021   PT End of Session - 07/12/21 0800     Visit Number 6    Number of Visits 24    Date for PT Re-Evaluation 09/09/21    Authorization Type BCBS    PT Start Time 0801    PT Stop Time 0845    PT Time Calculation (min) 44 min    Activity Tolerance Patient tolerated treatment well    Behavior During Therapy [facility] for tasks assessed/performed             Past Medical History:  Diagnosis Date   Anxiety    Asthma due to seasonal allergies    COPD (chronic obstructive pulmonary disease) ([place])    Depression    Smoker    smokes 1ppd    Past Surgical History:  Procedure Laterality Date   ABLATION     REVERSE SHOULDER ARTHROPLASTY Left 05/13/2021   Procedure: REVERSE SHOULDER ARTHROPLASTY;  Surgeon: Hiram Gash, MD;  Location: [place];  Service: Orthopedics;  Laterality: Left;   TIBIA FRACTURE SURGERY Right     There were no vitals filed for this visit.   Subjective Assessment - 07/12/21 0802     Subjective "Doesn't feel too bad." Pt states she didn't do exercises yesterday but was able to do table stretches at work.    Pertinent History Had prior R shoulder fx    Limitations Lifting;House hold activities;Writing    How long can you sit comfortably? n/a    How long can you stand comfortably? n/a    How long can you walk comfortably? n/a    Diagnostic tests n/a    Patient Stated Goals Improve shoulder movement to PLOF    Currently in Pain? Yes    Pain Score 4     Pain Location Shoulder    Pain Orientation Left                [facility] PT Assessment - 07/12/21 0001       Assessment   Medical Diagnosis L reverse TSA (5  weeks post op)    Referring Provider (PT) Ophelia Charter MD    Onset Date/Surgical Date 05/13/21    Hand Dominance Right      Precautions   Precaution Comments phase 2 (weeks 4-12); okay to begin resisted flex, ER, abd as able. Shoulder isometrics. Light stretching at end range.      Restrictions   Other Position/Activity Restrictions 5 lb weight limit      PROM   Left Shoulder Flexion 121 Degrees    Left Shoulder ABduction 89 Degrees    Left Shoulder External Rotation 29 Degrees                           [facility] Adult PT Treatment/Exercise - 07/12/21 0001       Shoulder Exercises: Seated   External Rotation AAROM;Left;10 reps    External Rotation Limitations at table    Flexion AAROM;Left;10 reps    Flexion Limitations at table    Abduction AAROM;Left;20 reps    ABduction Limitations at table    Other Seated Exercises scap squeeze x 10      Shoulder  Exercises: Standing   External Rotation Strengthening;Left;Theraband    Theraband Level (Shoulder External Rotation) Level 1 (Yellow)    External Rotation Limitations reactive isometrics    Internal Rotation Strengthening;Left;10 reps;Theraband    Theraband Level (Shoulder Internal Rotation) Level 1 (Yellow)    Internal Rotation Limitations reactive isometrics    Flexion Strengthening;Left;10 reps;Theraband    Theraband Level (Shoulder Flexion) Level 1 (Yellow)    Flexion Limitations reactive isometrics    ABduction Strengthening;Left;10 reps;Theraband    Theraband Level (Shoulder ABduction) Level 1 (Yellow)    ABduction Limitations reactive isometrics    Row Strengthening;Both;Theraband    Theraband Level (Shoulder Row) Level 1 (Yellow)      Shoulder Exercises: Pulleys   Flexion 1 minute    ABduction 1 minute      Vasopneumatic   Number Minutes Vasopneumatic  10 minutes    Vasopnuematic Location  Shoulder    Vasopneumatic Pressure Low    Vasopneumatic Temperature  34      Manual Therapy   Manual  Therapy Passive ROM;Soft tissue mobilization    Soft tissue mobilization Lt bicep and pecs to decrease pain and improve mobility    Passive ROM Lt shoulder flexion, abd and ER to tolerance and tissue limits                       PT Short Term Goals - 06/17/21 0908       PT SHORT TERM GOAL #1   Title Pt will be independent with initial HEP    Time 4    Period Weeks    Status New    Target Date 07/15/21      PT SHORT TERM GOAL #2   Title Pt will be able to perform shoulder flexion and abd AROM to at least 90 deg    Time 4    Period Weeks    Status New    Target Date 07/15/21      PT SHORT TERM GOAL #3   Title Pt will report decrease in pain by at least 25%    Time 4    Period Weeks    Status New    Target Date 07/15/21               PT Long Term Goals - 06/17/21 0913       PT LONG TERM GOAL #1   Title Pt will be independent with advanced HEP for strengthening    Time 12    Period Weeks    Status New    Target Date 09/09/21      PT LONG TERM GOAL #2   Title Pt will improve shoulder AROM to at least 140 deg in flexion and abd for overhead activities    Time 12    Period Weeks    Status New    Target Date 09/09/21      PT LONG TERM GOAL #3   Title Pt will be able to lift at least 5 lbs over head to demo increased functional strength    Time 12    Period Weeks    Status New    Target Date 09/09/21      PT LONG TERM GOAL #4   Title Pt will demo at least 4/5 shoulder strength in all directions    Time 12    Period Weeks    Status New    Target Date 09/09/21      PT LONG TERM GOAL #  5   Title Pt will have improved FOTO score to at least 70    Baseline 50    Time 12    Period Weeks    Status New    Target Date 09/09/21      Additional Long Term Goals   Additional Long Term Goals Yes      PT LONG TERM GOAL #6   Title **After 09/02/21** Pt will be able to reach behind her back for dressing and bathing    Baseline Limited due to shoulder  precautions    Time 12    Period Weeks    Status New    Target Date 09/09/21                   Plan - 07/12/21 0833     Clinical Impression Statement Pt with improving overall shoulder PROM. Less pain in biceps today. Increased pt's shoulder strengthening with gentle band reactive isometrics. Worked on stretching shoulder abduction. Will continue to work on AROM.    Personal Factors and Comorbidities Age;Time since onset of injury/illness/exacerbation;Profession    Examination-Activity Limitations Reach Overhead;Caring for Others;Carry;Toileting;Hygiene/Grooming;Dressing;Lift    Examination-Participation Restrictions Cleaning;Meal Prep;Occupation;Shop;Driving;Community Activity;Laundry;Yard Work    Stability/Clinical Decision Making Stable/Uncomplicated    Rehab Potential Good    PT Frequency 2x / week    PT Duration 8 weeks    PT Treatment/Interventions ADLs/Self Care Home Management;Aquatic Therapy;Cryotherapy;Electrical Stimulation;Iontophoresis 4mg /ml Dexamethasone;Moist Heat;Gait training;Stair training;Functional mobility training;Therapeutic activities;Therapeutic exercise;Balance training;Neuromuscular re-education;Manual techniques;Patient/family education;Dry needling;Passive range of motion;Scar mobilization;Taping    PT Next Visit Plan progress HEP, ROM and strength per protocol    PT Home Exercise Plan Access Code: [email or web address]    Consulted and Agree with Plan of Care Patient             Patient will benefit from skilled therapeutic intervention in order to improve the following deficits and impairments:  Decreased range of motion, Increased fascial restricitons, Increased muscle spasms, Impaired UE functional use, Decreased endurance, Decreased activity tolerance, Pain, Improper body mechanics, Decreased scar mobility, Decreased mobility, Decreased strength, Increased edema, Postural dysfunction, Hypomobility  Visit Diagnosis: Acute pain of left  shoulder  Stiffness of left shoulder, not elsewhere classified  Muscle weakness (generalized)  Localized edema  Abnormal posture     Problem List Patient Active Problem List   Diagnosis Date Noted   Fracture of humerus, proximal, right, closed 02/26/2020    [facility] April Ma L Shenekia Riess, PT, DPT 07/12/2021, 8:37 AM  [facility] [place], [place], [address] Phone: [phone]   Fax:  [phone]  Name: Janet Harper MRN: [id number] Date of Birth: December 09, 1965

## 2021-07-15 ENCOUNTER — Other Ambulatory Visit: Payer: Self-pay

## 2021-07-15 ENCOUNTER — Ambulatory Visit: Payer: BC Managed Care – PPO | Admitting: Physical Therapy

## 2021-07-15 DIAGNOSIS — R293 Abnormal posture: Secondary | ICD-10-CM

## 2021-07-15 DIAGNOSIS — R600 Localized edema: Secondary | ICD-10-CM

## 2021-07-15 DIAGNOSIS — M25512 Pain in left shoulder: Secondary | ICD-10-CM

## 2021-07-15 DIAGNOSIS — M25612 Stiffness of left shoulder, not elsewhere classified: Secondary | ICD-10-CM

## 2021-07-15 DIAGNOSIS — M6281 Muscle weakness (generalized): Secondary | ICD-10-CM

## 2021-07-15 NOTE — Therapy (Signed)
[facility] [name] [address] [place], [place], [address] Phone: [phone]   Fax:  [phone]  Physical Therapy Treatment  Patient Details  Name: Janet Harper MRN: [id number] Date of Birth: [birth year] Referring Provider (PT): Ramond Marrow MD   Encounter Date: 07/15/2021   PT End of Session - 07/15/21 0803     Visit Number 7    Number of Visits 24    Date for PT Re-Evaluation 09/09/21    Authorization Type BCBS    PT Start Time 0803    PT Stop Time 0845    PT Time Calculation (min) 42 min    Activity Tolerance Patient tolerated treatment well    Behavior During Therapy [facility] for tasks assessed/performed             Past Medical History:  Diagnosis Date   Anxiety    Asthma due to seasonal allergies    COPD (chronic obstructive pulmonary disease) (HCC)    Depression    Smoker    smokes 1ppd    Past Surgical History:  Procedure Laterality Date   ABLATION     REVERSE SHOULDER ARTHROPLASTY Left 05/13/2021   Procedure: REVERSE SHOULDER ARTHROPLASTY;  Surgeon: Bjorn Pippin, MD;  Location: [facility];  Service: Orthopedics;  Laterality: Left;   TIBIA FRACTURE SURGERY Right     There were no vitals filed for this visit.   Subjective Assessment - 07/15/21 0805     Subjective "It felt really good when I first woke up but I was moving really fast getting ready and started tweaking a little bit."    Pertinent History Had prior R shoulder fx    Limitations Lifting;House hold activities;Writing    How long can you sit comfortably? n/a    How long can you stand comfortably? n/a    How long can you walk comfortably? n/a    Diagnostic tests n/a    Patient Stated Goals Improve shoulder movement to PLOF    Currently in Pain? Yes    Pain Score 4     Pain Location Shoulder                [facility] PT Assessment - 07/15/21 0001       Assessment   Medical Diagnosis L reverse TSA (5 weeks post op)     Referring Provider (PT) Ramond Marrow MD    Onset Date/Surgical Date 05/13/21    Hand Dominance Right      Precautions   Precaution Comments phase 2 (weeks 4-12); okay to begin resisted flex, ER, abd as able. Shoulder isometrics. Light stretching at end range.      Restrictions   Other Position/Activity Restrictions 5 lb weight limit                           [facility] Adult PT Treatment/Exercise - 07/15/21 0001       Shoulder Exercises: Supine   External Rotation AROM;Strengthening;10 reps    Flexion AROM;10 reps;Strengthening    ABduction AROM;Strengthening;10 reps      Shoulder Exercises: Seated   Retraction Strengthening;20 reps      Shoulder Exercises: Sidelying   External Rotation AROM;Strengthening;20 reps    Flexion AROM;Strengthening;10 reps    ABduction AROM;Strengthening;Left    Other Sidelying Exercises scapular depression, retraction, protraction 2x10      Shoulder Exercises: Pulleys   Flexion 2 minutes    ABduction 1 minute  Shoulder Exercises: ROM/Strengthening   Pendulum forward/back x30 sec, laterally x30 sec, circles CW & CCW x30 sec each      Vasopneumatic   Number Minutes Vasopneumatic  10 minutes    Vasopnuematic Location  Shoulder    Vasopneumatic Pressure Low    Vasopneumatic Temperature  34      Manual Therapy   Soft tissue mobilization Lt bicep and pecs to decrease pain and improve mobility    Passive ROM Lt shoulder flexion, abd and ER to tolerance and tissue limits                       PT Short Term Goals - 06/17/21 0908       PT SHORT TERM GOAL #1   Title Pt will be independent with initial HEP    Time 4    Period Weeks    Status New    Target Date 07/15/21      PT SHORT TERM GOAL #2   Title Pt will be able to perform shoulder flexion and abd AROM to at least 90 deg    Time 4    Period Weeks    Status New    Target Date 07/15/21      PT SHORT TERM GOAL #3   Title Pt will report decrease in pain  by at least 25%    Time 4    Period Weeks    Status New    Target Date 07/15/21               PT Long Term Goals - 06/17/21 0913       PT LONG TERM GOAL #1   Title Pt will be independent with advanced HEP for strengthening    Time 12    Period Weeks    Status New    Target Date 09/09/21      PT LONG TERM GOAL #2   Title Pt will improve shoulder AROM to at least 140 deg in flexion and abd for overhead activities    Time 12    Period Weeks    Status New    Target Date 09/09/21      PT LONG TERM GOAL #3   Title Pt will be able to lift at least 5 lbs over head to demo increased functional strength    Time 12    Period Weeks    Status New    Target Date 09/09/21      PT LONG TERM GOAL #4   Title Pt will demo at least 4/5 shoulder strength in all directions    Time 12    Period Weeks    Status New    Target Date 09/09/21      PT LONG TERM GOAL #5   Title Pt will have improved FOTO score to at least 70    Baseline 50    Time 12    Period Weeks    Status New    Target Date 09/09/21      Additional Long Term Goals   Additional Long Term Goals Yes      PT LONG TERM GOAL #6   Title **After 09/02/21** Pt will be able to reach behind her back for dressing and bathing    Baseline Limited due to shoulder precautions    Time 12    Period Weeks    Status New    Target Date 09/09/21  Plan - 07/15/21 0833     Clinical Impression Statement Treatment session focused on shoulder AROM this session in supine and sidelying. Pt reports increased soreness after exercises this session. Updated HEP to include this. Shoulder ROM is improving overall with abd reaching >90 deg this session; shoulder ER is still the tightest.    Personal Factors and Comorbidities Age;Time since onset of injury/illness/exacerbation;Profession    Examination-Activity Limitations Reach Overhead;Caring for Others;Carry;Toileting;Hygiene/Grooming;Dressing;Lift     Examination-Participation Restrictions Cleaning;Meal Prep;Occupation;Shop;Driving;Community Activity;Laundry;Yard Work    Stability/Clinical Decision Making Stable/Uncomplicated    Rehab Potential Good    PT Frequency 2x / week    PT Duration 8 weeks    PT Treatment/Interventions ADLs/Self Care Home Management;Aquatic Therapy;Cryotherapy;Electrical Stimulation;Iontophoresis 4mg /ml Dexamethasone;Moist Heat;Gait training;Stair training;Functional mobility training;Therapeutic activities;Therapeutic exercise;Balance training;Neuromuscular re-education;Manual techniques;Patient/family education;Dry needling;Passive range of motion;Scar mobilization;Taping    PT Next Visit Plan progress HEP, ROM and strength per protocol. 12 week mark on 08/05/21.    PT Home Exercise Plan Access Code: [email or web address]    Consulted and Agree with Plan of Care Patient             Patient will benefit from skilled therapeutic intervention in order to improve the following deficits and impairments:  Decreased range of motion, Increased fascial restricitons, Increased muscle spasms, Impaired UE functional use, Decreased endurance, Decreased activity tolerance, Pain, Improper body mechanics, Decreased scar mobility, Decreased mobility, Decreased strength, Increased edema, Postural dysfunction, Hypomobility  Visit Diagnosis: Acute pain of left shoulder  Stiffness of left shoulder, not elsewhere classified  Muscle weakness (generalized)  Localized edema  Abnormal posture     Problem List Patient Active Problem List   Diagnosis Date Noted   Fracture of humerus, proximal, right, closed 02/26/2020    [facility] April Ma L Ritta Hammes, PT, DPT 07/15/2021, 8:36 AM  [facility] [address] [place], [place], [place] Phone: [phone]   Fax:  [phone]  Name: Coila Wardell MRN: Cecille Aver Date of Birth: 06-03-1966

## 2021-07-19 ENCOUNTER — Other Ambulatory Visit: Payer: Self-pay

## 2021-07-19 ENCOUNTER — Ambulatory Visit: Payer: BC Managed Care – PPO | Admitting: Physical Therapy

## 2021-07-19 ENCOUNTER — Encounter: Payer: Self-pay | Admitting: Physical Therapy

## 2021-07-19 DIAGNOSIS — M25512 Pain in left shoulder: Secondary | ICD-10-CM

## 2021-07-19 DIAGNOSIS — R600 Localized edema: Secondary | ICD-10-CM

## 2021-07-19 DIAGNOSIS — R293 Abnormal posture: Secondary | ICD-10-CM

## 2021-07-19 DIAGNOSIS — M6281 Muscle weakness (generalized): Secondary | ICD-10-CM

## 2021-07-19 DIAGNOSIS — M25612 Stiffness of left shoulder, not elsewhere classified: Secondary | ICD-10-CM

## 2021-07-19 NOTE — Therapy (Signed)
[place], [place], [address] Phone: [phone]   Fax:  [phone]  Physical Therapy Treatment  Patient Details  Name: Janet Harper MRN: [id number] Date of Birth: 1965/08/14 Referring Provider (PT): Ophelia Charter MD   Encounter Date: 07/19/2021   PT End of Session - 07/19/21 0816     Visit Number 8    Number of Visits 24    Date for PT Re-Evaluation 09/09/21    Authorization Type BCBS    PT Start Time 0804    PT Stop Time 0849    PT Time Calculation (min) 45 min    Activity Tolerance Patient tolerated treatment well    Behavior During Therapy [facility] for tasks assessed/performed             Past Medical History:  Diagnosis Date   Anxiety    Asthma due to seasonal allergies    COPD (chronic obstructive pulmonary disease) ([place])    Depression    Smoker    smokes 1ppd    Past Surgical History:  Procedure Laterality Date   ABLATION     REVERSE SHOULDER ARTHROPLASTY Left 05/13/2021   Procedure: REVERSE SHOULDER ARTHROPLASTY;  Surgeon: Hiram Gash, MD;  Location: [place];  Service: Orthopedics;  Laterality: Left;   TIBIA FRACTURE SURGERY Right     There were no vitals filed for this visit.   Subjective Assessment - 07/19/21 0809     Subjective Pt reports she completes her HEP "not as consistant as should be", but she reports she is constantly stretching and isometrics with her arm when she is sitting in recliner.    Patient Stated Goals Improve shoulder movement to PLOF    Currently in Pain? Yes    Pain Score 3     Pain Location Shoulder    Pain Orientation Left    Pain Descriptors / Indicators Dull;Aching    Aggravating Factors  certain motions, weather    Pain Relieving Factors OTC meds                [facility] PT Assessment - 07/19/21 0001       Assessment   Medical Diagnosis L reverse TSA    Referring Provider (PT) Ophelia Charter MD    Onset Date/Surgical  Date 05/13/21    Hand Dominance Right      AROM   Left Shoulder Flexion 110 Degrees    Left Shoulder ABduction 80 Degrees    Left Shoulder External Rotation 18 Degrees   elbow at side             [facility] Adult PT Treatment/Exercise - 07/19/21 0001       Shoulder Exercises: Standing   Other Standing Exercises scap retraction against noodle x 5 sec x 5 reps,  shoulder rolls x 10,  L's x 5 sec x 10 reps,  pendulum CW/CCW    Other Standing Exercises Wall ladder with LUE, in flexion x 1 rep      Shoulder Exercises: Pulleys   Flexion 3 minutes    Scaption 2 minutes      Shoulder Exercises: Isometric Strengthening   External Rotation 5X10"    ABduction 5X10"      Shoulder Exercises: Stretch   Wall Stretch - Flexion 5 reps;10 seconds   wall wash   Table Stretch - Flexion 5 reps   rolling table forward   Star Gazer Stretch 1 rep;20 seconds   AAROM from RUE, hands  resting on forehead in supine     Vasopneumatic   Number Minutes Vasopneumatic  10 minutes    Vasopnuematic Location  Shoulder   Lt shoulder   Vasopneumatic Pressure Low    Vasopneumatic Temperature  34      Manual Therapy   Manual Therapy Soft tissue mobilization;Passive ROM;Taping    Soft tissue mobilization Lt pec    Passive ROM Lt shoulder ER (in scapular plane),  scaption - to tissue limits and no pain.    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                       PT Short Term Goals - 07/19/21 1047       PT SHORT TERM GOAL #1   Title Pt will be independent with initial HEP    Time 4    Period Weeks    Status Partially Met    Target Date 07/15/21      PT SHORT TERM GOAL #2   Title Pt will be able to perform shoulder flexion and abd AROM to at least 90 deg    Time 4    Period Weeks    Status Partially Met    Target Date 07/15/21      PT SHORT  TERM GOAL #3   Title Pt will report decrease in pain by at least 25%    Time 4    Period Weeks    Status On-going    Target Date 07/15/21               PT Long Term Goals - 06/17/21 0913       PT LONG TERM GOAL #1   Title Pt will be independent with advanced HEP for strengthening    Time 12    Period Weeks    Status New    Target Date 09/09/21      PT LONG TERM GOAL #2   Title Pt will improve shoulder AROM to at least 140 deg in flexion and abd for overhead activities    Time 12    Period Weeks    Status New    Target Date 09/09/21      PT LONG TERM GOAL #3   Title Pt will be able to lift at least 5 lbs over head to demo increased functional strength    Time 12    Period Weeks    Status New    Target Date 09/09/21      PT LONG TERM GOAL #4   Title Pt will demo at least 4/5 shoulder strength in all directions    Time 12    Period Weeks    Status New    Target Date 09/09/21      PT LONG TERM GOAL #5   Title Pt will have improved FOTO score to at least 70    Baseline 50    Time 12    Period Weeks    Status New    Target Date 09/09/21      Additional Long Term Goals   Additional Long Term Goals Yes      PT LONG TERM GOAL #6   Title **After 09/02/21** Pt will be able to reach behind her back for dressing and bathing    Baseline Limited due to  shoulder precautions    Time 12    Period Weeks    Status New    Target Date 09/09/21                   Plan - 07/19/21 1051     Clinical Impression Statement Pt's ROM gradually improving.  She requires minor cues during session to relax upper traps with exercises.  She reported some minor increase in discomfort during session; reduced with use of vaso at end.  Pt progressing gradually towards STG/LTGs.    Personal Factors and Comorbidities Age;Time since onset of injury/illness/exacerbation;Profession    Examination-Activity Limitations Reach Overhead;Caring for  Others;Carry;Toileting;Hygiene/Grooming;Dressing;Lift    Examination-Participation Restrictions Cleaning;Meal Prep;Occupation;Shop;Driving;Community Activity;Laundry;Yard Work    Stability/Clinical Decision Making Stable/Uncomplicated    Rehab Potential Good    PT Frequency 2x / week    PT Duration 8 weeks    PT Treatment/Interventions ADLs/Self Care Home Management;Aquatic Therapy;Cryotherapy;Electrical Stimulation;Iontophoresis 33m/ml Dexamethasone;Moist Heat;Gait training;Stair training;Functional mobility training;Therapeutic activities;Therapeutic exercise;Balance training;Neuromuscular re-education;Manual techniques;Patient/family education;Dry needling;Passive range of motion;Scar mobilization;Taping    PT Next Visit Plan progress HEP, ROM and strength per protocol.  Issue band for reactive isometric. 12 week mark on 08/05/21.    PT Home Exercise Plan Access Code: [email or web address]   Consulted and Agree with Plan of Care Patient             Patient will benefit from skilled therapeutic intervention in order to improve the following deficits and impairments:  Decreased range of motion, Increased fascial restricitons, Increased muscle spasms, Impaired UE functional use, Decreased endurance, Decreased activity tolerance, Pain, Improper body mechanics, Decreased scar mobility, Decreased mobility, Decreased strength, Increased edema, Postural dysfunction, Hypomobility  Visit Diagnosis: Acute pain of left shoulder  Stiffness of left shoulder, not elsewhere classified  Muscle weakness (generalized)  Localized edema  Abnormal posture     Problem List Patient Active Problem List   Diagnosis Date Noted   Fracture of humerus, proximal, right, closed 02/26/2020   JKerin Perna PTA 07/19/21 10:53 AM  [place] [address] [address]Phone: [phone]  Fax:  [phone] Name: JMackenna KamerMRN:  [id number] of Birth: [birth year]

## 2021-07-22 ENCOUNTER — Ambulatory Visit: Payer: BC Managed Care – PPO | Admitting: Physical Therapy

## 2021-07-22 ENCOUNTER — Encounter: Payer: Self-pay | Admitting: Physical Therapy

## 2021-07-22 ENCOUNTER — Other Ambulatory Visit: Payer: Self-pay

## 2021-07-22 DIAGNOSIS — M25512 Pain in left shoulder: Secondary | ICD-10-CM

## 2021-07-22 DIAGNOSIS — R600 Localized edema: Secondary | ICD-10-CM

## 2021-07-22 DIAGNOSIS — M25612 Stiffness of left shoulder, not elsewhere classified: Secondary | ICD-10-CM

## 2021-07-22 DIAGNOSIS — M6281 Muscle weakness (generalized): Secondary | ICD-10-CM

## 2021-07-22 NOTE — Therapy (Signed)
[place], [place], [address] Phone: [phone]   Fax:  [phone]  Physical Therapy Treatment  Patient Details  Name: Janet Harper MRN: [id number] Date of Birth: Oct 13, 1965 Referring Provider (PT): Ophelia Charter MD   Encounter Date: 07/22/2021   PT End of Session - 07/22/21 1625     Visit Number 9    Number of Visits 24    Date for PT Re-Evaluation 09/09/21    Authorization Type BCBS    PT Start Time 1619    PT Stop Time 1701    PT Time Calculation (min) 42 min    Activity Tolerance Patient tolerated treatment well    Behavior During Therapy [facility] for tasks assessed/performed             Past Medical History:  Diagnosis Date   Anxiety    Asthma due to seasonal allergies    COPD (chronic obstructive pulmonary disease) ([place])    Depression    Smoker    smokes 1ppd    Past Surgical History:  Procedure Laterality Date   ABLATION     REVERSE SHOULDER ARTHROPLASTY Left 05/13/2021   Procedure: REVERSE SHOULDER ARTHROPLASTY;  Surgeon: Hiram Gash, MD;  Location: [place];  Service: Orthopedics;  Laterality: Left;   TIBIA FRACTURE SURGERY Right     There were no vitals filed for this visit.   Subjective Assessment - 07/22/21 1625     Subjective Pt reports that she has been trying to use her Lt arm more for functional actions (ie: tipping cigarette out window).She complains of continued stiffness.    Pertinent History Had prior R shoulder fx    Patient Stated Goals Improve shoulder movement to PLOF    Currently in Pain? Yes    Pain Score 3     Pain Location Shoulder    Pain Orientation Left    Pain Descriptors / Indicators Aching    Aggravating Factors  certain motions, weather    Pain Relieving Factors OTC meds, ice                [facility] PT Assessment - 07/22/21 0001       Assessment   Medical Diagnosis L reverse TSA    Referring Provider (PT) Ophelia Charter  MD    Onset Date/Surgical Date 05/13/21    Hand Dominance Right    Next MD Visit 08/05/21    Prior Therapy R shoulder fx              [facility] Adult PT Treatment/Exercise - 07/22/21 0001       Shoulder Exercises: Standing   ABduction AAROM;Left;10 reps   scaption, cane, use of mirror for visual feedback.   Other Standing Exercises shoulder rolls x 10 reps,  pendulum CW/CCW      Shoulder Exercises: Pulleys   Flexion 3 minutes    Scaption 2 minutes      Shoulder Exercises: Isometric Strengthening   Flexion Limitations reactive isometric yellow TB x 3 sec x 10    Extension Limitations reactive isometric yellow TB x 3 sec x 5    External Rotation Limitations reactive isometric yellow TB x 3 sec x 10    ABduction Limitations reactive isometric yellow TB x 3 sec x 10      Shoulder Exercises: Stretch   Star Gazer Stretch 5 reps;10 seconds      Vasopneumatic   Number Minutes Vasopneumatic  10 minutes  Vasopnuematic Location  Shoulder   Lt shoulder   Vasopneumatic Pressure Low    Vasopneumatic Temperature  34      Manual Therapy   Manual therapy comments tape removed at beginning of session; no sensitivity noted.    Soft tissue mobilization Lt pec, lat, teres major    Passive ROM Lt shoulder ER (in scapular plane),  scaption - to tissue limits and no pain.                       PT Short Term Goals - 07/19/21 1047       PT SHORT TERM GOAL #1   Title Pt will be independent with initial HEP    Time 4    Period Weeks    Status Partially Met    Target Date 07/15/21      PT SHORT TERM GOAL #2   Title Pt will be able to perform shoulder flexion and abd AROM to at least 90 deg    Time 4    Period Weeks    Status Partially Met    Target Date 07/15/21      PT SHORT TERM GOAL #3   Title Pt will report decrease in pain by at least 25%    Time 4    Period Weeks    Status On-going    Target Date 07/15/21               PT Long Term Goals - 06/17/21 0913        PT LONG TERM GOAL #1   Title Pt will be independent with advanced HEP for strengthening    Time 12    Period Weeks    Status New    Target Date 09/09/21      PT LONG TERM GOAL #2   Title Pt will improve shoulder AROM to at least 140 deg in flexion and abd for overhead activities    Time 12    Period Weeks    Status New    Target Date 09/09/21      PT LONG TERM GOAL #3   Title Pt will be able to lift at least 5 lbs over head to demo increased functional strength    Time 12    Period Weeks    Status New    Target Date 09/09/21      PT LONG TERM GOAL #4   Title Pt will demo at least 4/5 shoulder strength in all directions    Time 12    Period Weeks    Status New    Target Date 09/09/21      PT LONG TERM GOAL #5   Title Pt will have improved FOTO score to at least 70    Baseline 50    Time 12    Period Weeks    Status New    Target Date 09/09/21      Additional Long Term Goals   Additional Long Term Goals Yes      PT LONG TERM GOAL #6   Title **After 09/02/21** Pt will be able to reach behind her back for dressing and bathing    Baseline Limited due to shoulder precautions    Time 12    Period Weeks    Status New    Target Date 09/09/21                   Plan - 07/22/21 1655  Clinical Impression Statement Pt less guarded with PROM today.  Pt continues to require cues to avoid hiking scapula with forward flexion and scaption.  She tolerated exercises with minor increase in discomfort; improved with Vaso at end of session.    Personal Factors and Comorbidities Age;Time since onset of injury/illness/exacerbation;Profession    Examination-Activity Limitations Reach Overhead;Caring for Others;Carry;Toileting;Hygiene/Grooming;Dressing;Lift    Examination-Participation Restrictions Cleaning;Meal Prep;Occupation;Shop;Driving;Community Activity;Laundry;Yard Work    Stability/Clinical Decision Making Stable/Uncomplicated    Rehab Potential Good    PT  Frequency 2x / week    PT Duration 8 weeks    PT Treatment/Interventions ADLs/Self Care Home Management;Aquatic Therapy;Cryotherapy;Electrical Stimulation;Iontophoresis 68m/ml Dexamethasone;Moist Heat;Gait training;Stair training;Functional mobility training;Therapeutic activities;Therapeutic exercise;Balance training;Neuromuscular re-education;Manual techniques;Patient/family education;Dry needling;Passive range of motion;Scar mobilization;Taping    PT Next Visit Plan progress HEP, ROM and strength per protocol.  Issue band for reactive isometric. 12 week mark on 08/05/21.    PT Home Exercise Plan Access Code: [email or web address]   Consulted and Agree with Plan of Care Patient             Patient will benefit from skilled therapeutic intervention in order to improve the following deficits and impairments:  Decreased range of motion, Increased fascial restricitons, Increased muscle spasms, Impaired UE functional use, Decreased endurance, Decreased activity tolerance, Pain, Improper body mechanics, Decreased scar mobility, Decreased mobility, Decreased strength, Increased edema, Postural dysfunction, Hypomobility  Visit Diagnosis: Acute pain of left shoulder  Stiffness of left shoulder, not elsewhere classified  Muscle weakness (generalized)  Localized edema     Problem List Patient Active Problem List   Diagnosis Date Noted   Fracture of humerus, proximal, right, closed 02/26/2020   JKerin Perna PTA 07/22/21 5:00 PM   [place] [address] [address]Phone: [phone]  Fax:  [phone] Name: JLidiya ReiseMRN: [id number] of Birth: 608-09-1965

## 2021-07-26 ENCOUNTER — Ambulatory Visit: Payer: BC Managed Care – PPO | Admitting: Physical Therapy

## 2021-07-26 ENCOUNTER — Other Ambulatory Visit: Payer: Self-pay

## 2021-07-26 DIAGNOSIS — R600 Localized edema: Secondary | ICD-10-CM

## 2021-07-26 DIAGNOSIS — M25512 Pain in left shoulder: Secondary | ICD-10-CM | POA: Diagnosis not present

## 2021-07-26 DIAGNOSIS — M25612 Stiffness of left shoulder, not elsewhere classified: Secondary | ICD-10-CM

## 2021-07-26 DIAGNOSIS — M6281 Muscle weakness (generalized): Secondary | ICD-10-CM

## 2021-07-26 NOTE — Therapy (Signed)
[place], [place], [address] Phone: [phone]   Fax:  [phone]  Physical Therapy Treatment  Patient Details  Name: Janet Harper MRN: [id number] Date of Birth: [birth year] Referring Provider (PT): Ophelia Charter MD   Encounter Date: 07/26/2021   PT End of Session - 07/26/21 0804     Visit Number 10    Number of Visits 24    Date for PT Re-Evaluation 09/09/21    Authorization Type BCBS    PT Start Time 0803    PT Stop Time 0845    PT Time Calculation (min) 42 min    Activity Tolerance Patient tolerated treatment well    Behavior During Therapy [facility] for tasks assessed/performed             Past Medical History:  Diagnosis Date   Anxiety    Asthma due to seasonal allergies    COPD (chronic obstructive pulmonary disease) ([place])    Depression    Smoker    smokes 1ppd    Past Surgical History:  Procedure Laterality Date   ABLATION     REVERSE SHOULDER ARTHROPLASTY Left 05/13/2021   Procedure: REVERSE SHOULDER ARTHROPLASTY;  Surgeon: Hiram Gash, MD;  Location: [place];  Service: Orthopedics;  Laterality: Left;   TIBIA FRACTURE SURGERY Right     There were no vitals filed for this visit.   Subjective Assessment - 07/26/21 0804     Subjective "I was a little slack this weekend. I did absolutely nothing. I couldn't get comfortable last night but not because of my shoulder."    Pertinent History Had prior R shoulder fx    Limitations Lifting;House hold activities;Writing    How long can you sit comfortably? n/a    How long can you stand comfortably? n/a    How long can you walk comfortably? n/a    Diagnostic tests n/a    Patient Stated Goals Improve shoulder movement to PLOF    Currently in Pain? Yes    Pain Score 4     Pain Location Shoulder    Pain Orientation Left    Pain Descriptors / Indicators Aching                [facility] PT Assessment - 07/26/21 0001        Assessment   Medical Diagnosis L reverse TSA    Referring Provider (PT) Ophelia Charter MD    Onset Date/Surgical Date 05/13/21    Hand Dominance Right    Next MD Visit 08/05/21    Prior Therapy R shoulder fx                           [facility] Adult PT Treatment/Exercise - 07/26/21 0001       Shoulder Exercises: Supine   Flexion AROM;10 reps;Strengthening      Shoulder Exercises: Sidelying   External Rotation AROM;Strengthening;10 reps    Flexion AROM;Strengthening;10 reps    ABduction AROM;Strengthening;Left    ABduction Limitations scaption    Other Sidelying Exercises scapular retraction + depression x10      Shoulder Exercises: Standing   Flexion AAROM;Strengthening;10 reps    Flexion Limitations wall wash    ABduction AAROM;Left;10 reps    ABduction Limitations wall wash    Other Standing Exercises Wall ladder: flexion x 5 reps (able to reach 24), abd x 5 reps (reaches 18)      Shoulder Exercises:  Pulleys   Flexion 2 minutes    Scaption 2 minutes      Shoulder Exercises: Stretch   Other Shoulder Stretches Forearm + elbow extension stretch 2x20 sec      Vasopneumatic   Number Minutes Vasopneumatic  10 minutes    Vasopnuematic Location  Shoulder    Vasopneumatic Pressure Low    Vasopneumatic Temperature  34      Manual Therapy   Soft tissue mobilization STM L bicep and deltoid    Passive ROM Lt shoulder flex, IR/ER, abd to tissue restrictions                       PT Short Term Goals - 07/19/21 1047       PT SHORT TERM GOAL #1   Title Pt will be independent with initial HEP    Time 4    Period Weeks    Status Partially Met    Target Date 07/15/21      PT SHORT TERM GOAL #2   Title Pt will be able to perform shoulder flexion and abd AROM to at least 90 deg    Time 4    Period Weeks    Status Partially Met    Target Date 07/15/21      PT SHORT TERM GOAL #3   Title Pt will report decrease in pain by at least 25%    Time  4    Period Weeks    Status On-going    Target Date 07/15/21               PT Long Term Goals - 06/17/21 0913       PT LONG TERM GOAL #1   Title Pt will be independent with advanced HEP for strengthening    Time 12    Period Weeks    Status New    Target Date 09/09/21      PT LONG TERM GOAL #2   Title Pt will improve shoulder AROM to at least 140 deg in flexion and abd for overhead activities    Time 12    Period Weeks    Status New    Target Date 09/09/21      PT LONG TERM GOAL #3   Title Pt will be able to lift at least 5 lbs over head to demo increased functional strength    Time 12    Period Weeks    Status New    Target Date 09/09/21      PT LONG TERM GOAL #4   Title Pt will demo at least 4/5 shoulder strength in all directions    Time 12    Period Weeks    Status New    Target Date 09/09/21      PT LONG TERM GOAL #5   Title Pt will have improved FOTO score to at least 70    Baseline 50    Time 12    Period Weeks    Status New    Target Date 09/09/21      Additional Long Term Goals   Additional Long Term Goals Yes      PT LONG TERM GOAL #6   Title **After 09/02/21** Pt will be able to reach behind her back for dressing and bathing    Baseline Limited due to shoulder precautions    Time 12    Period Weeks    Status New    Target Date 09/09/21  Plan - 07/26/21 0838     Clinical Impression Statement Worked on AAROM in standing with wall washing this session. Pt fatigues quickly. Pt with improving AROM in gravity eliminated positions. Reports some increased anterior shoulder tightness since last week -- biceps appears tighter with reduced elbow extension noted today. Discussed elbow/forearm extension stretch to see if this will help.    Personal Factors and Comorbidities Age;Time since onset of injury/illness/exacerbation;Profession    Examination-Activity Limitations Reach Overhead;Caring for  Others;Carry;Toileting;Hygiene/Grooming;Dressing;Lift    Examination-Participation Restrictions Cleaning;Meal Prep;Occupation;Shop;Driving;Community Activity;Laundry;Yard Work    Stability/Clinical Decision Making Stable/Uncomplicated    Rehab Potential Good    PT Frequency 2x / week    PT Duration 8 weeks    PT Treatment/Interventions ADLs/Self Care Home Management;Aquatic Therapy;Cryotherapy;Electrical Stimulation;Iontophoresis 49m/ml Dexamethasone;Moist Heat;Gait training;Stair training;Functional mobility training;Therapeutic activities;Therapeutic exercise;Balance training;Neuromuscular re-education;Manual techniques;Patient/family education;Dry needling;Passive range of motion;Scar mobilization;Taping    PT Next Visit Plan progress HEP, ROM and strength per protocol.  Issue band for reactive isometric. 12 week mark on 08/05/21.    PT Home Exercise Plan Access Code: [email or web address]   Consulted and Agree with Plan of Care Patient             Patient will benefit from skilled therapeutic intervention in order to improve the following deficits and impairments:  Decreased range of motion, Increased fascial restricitons, Increased muscle spasms, Impaired UE functional use, Decreased endurance, Decreased activity tolerance, Pain, Improper body mechanics, Decreased scar mobility, Decreased mobility, Decreased strength, Increased edema, Postural dysfunction, Hypomobility  Visit Diagnosis: Acute pain of left shoulder  Stiffness of left shoulder, not elsewhere classified  Muscle weakness (generalized)  Localized edema     Problem List Patient Active Problem List   Diagnosis Date Noted   Fracture of humerus, proximal, right, closed 02/26/2020    GUpmc Pinnacle LancasterApril Ma L Neils [place], PT, DPT 07/26/2021, 8:42 AM  [facility]1[place] [address]Phone: [phone]  Fax:  [phone] Name: Janet AndreMRN:  [id number] of Birth: 6Nov 09, 1967

## 2021-07-29 ENCOUNTER — Other Ambulatory Visit: Payer: Self-pay

## 2021-07-29 ENCOUNTER — Encounter: Payer: Self-pay | Admitting: Physical Therapy

## 2021-07-29 ENCOUNTER — Ambulatory Visit: Payer: BC Managed Care – PPO | Attending: Orthopaedic Surgery | Admitting: Physical Therapy

## 2021-07-29 DIAGNOSIS — M25512 Pain in left shoulder: Secondary | ICD-10-CM

## 2021-07-29 DIAGNOSIS — M25612 Stiffness of left shoulder, not elsewhere classified: Secondary | ICD-10-CM

## 2021-07-29 DIAGNOSIS — M25511 Pain in right shoulder: Secondary | ICD-10-CM | POA: Diagnosis present

## 2021-07-29 DIAGNOSIS — M6281 Muscle weakness (generalized): Secondary | ICD-10-CM | POA: Diagnosis present

## 2021-07-29 DIAGNOSIS — M25611 Stiffness of right shoulder, not elsewhere classified: Secondary | ICD-10-CM | POA: Insufficient documentation

## 2021-07-29 DIAGNOSIS — R600 Localized edema: Secondary | ICD-10-CM

## 2021-07-29 DIAGNOSIS — R293 Abnormal posture: Secondary | ICD-10-CM | POA: Insufficient documentation

## 2021-07-29 NOTE — Therapy (Signed)
[place], [place], [address] Phone: [phone]   Fax:  [phone]  Physical Therapy Treatment  Patient Details  Name: Janet Harper MRN: [id number] Date of Birth: Jan 29, 1966 Referring Provider (PT): Ophelia Charter MD   Encounter Date: 07/29/2021   PT End of Session - 07/29/21 1702     Visit Number 11    Number of Visits 24    Date for PT Re-Evaluation 09/09/21    Authorization Type BCBS    PT Start Time 1619    PT Stop Time 1707    PT Time Calculation (min) 48 min    Activity Tolerance Patient tolerated treatment well    Behavior During Therapy WFL for tasks assessed/performed             Past Medical History:  Diagnosis Date   Anxiety    Asthma due to seasonal allergies    COPD (chronic obstructive pulmonary disease) ([place])    Depression    Smoker    smokes 1ppd    Past Surgical History:  Procedure Laterality Date   ABLATION     REVERSE SHOULDER ARTHROPLASTY Left 05/13/2021   Procedure: REVERSE SHOULDER ARTHROPLASTY;  Surgeon: Hiram Gash, MD;  Location: [facility];  Service: Orthopedics;  Laterality: Left;   TIBIA FRACTURE SURGERY Right     There were no vitals filed for this visit.   Subjective Assessment - 07/29/21 1645     Subjective Pt reports her Lt shoulder has a constant dull ache that worsens with weather. she has been trying to use her LUE more for reaching light items.    Pertinent History Had prior R shoulder fx    Limitations Lifting;House hold activities;Writing    How long can you sit comfortably? n/a    How long can you stand comfortably? n/a    How long can you walk comfortably? n/a    Diagnostic tests n/a    Patient Stated Goals Improve shoulder movement to PLOF    Currently in Pain? Yes    Pain Score 3     Pain Location Shoulder    Pain Orientation Left    Pain Descriptors / Indicators Aching;Dull    Aggravating Factors  weather, certain  motions    Pain Relieving Factors ice, OTC meds                [facility] PT Assessment - 07/29/21 0001       Assessment   Medical Diagnosis L reverse TSA    Referring Provider (PT) Ophelia Charter MD    Onset Date/Surgical Date 05/13/21    Hand Dominance Right    Next MD Visit 08/05/21    Prior Therapy R shoulder fx               [facility] Adult PT Treatment/Exercise - 07/29/21 0001       Shoulder Exercises: Sidelying   External Rotation AROM;Strengthening;15 reps    Flexion AROM;Strengthening;15 reps    ABduction AROM;Strengthening;Left;15 reps   to 90     Shoulder Exercises: Standing   Other Standing Exercises pendulum with LUE    Other Standing Exercises Wall ladder: flexion x 8 reps (able to reach 25), scaption x 5 reps (reaches 22)      Shoulder Exercises: Pulleys   Flexion 3 minutes    ABduction 2 minutes      Shoulder Exercises: Stretch   Star Gazer Stretch 3 reps;30 seconds   back of palm  to forehead -> hand behind head     Vasopneumatic   Number Minutes Vasopneumatic  10 minutes    Vasopnuematic Location  Shoulder   Lt   Vasopneumatic Pressure Low    Vasopneumatic Temperature  34      Manual Therapy   Soft tissue mobilization STM L biceps brachii, pec major and deltoid to decrease fascial restrictions and improve mobility    Passive ROM Lt shoulder flexion, scaption, ER to tissue limits and no pain                       PT Short Term Goals - 07/19/21 1047       PT SHORT TERM GOAL #1   Title Pt will be independent with initial HEP    Time 4    Period Weeks    Status Partially Met    Target Date 07/15/21      PT SHORT TERM GOAL #2   Title Pt will be able to perform shoulder flexion and abd AROM to at least 90 deg    Time 4    Period Weeks    Status Partially Met    Target Date 07/15/21      PT SHORT TERM GOAL #3   Title Pt will report decrease in pain by at least 25%    Time 4    Period Weeks    Status On-going    Target Date  07/15/21               PT Long Term Goals - 06/17/21 0913       PT LONG TERM GOAL #1   Title Pt will be independent with advanced HEP for strengthening    Time 12    Period Weeks    Status New    Target Date 09/09/21      PT LONG TERM GOAL #2   Title Pt will improve shoulder AROM to at least 140 deg in flexion and abd for overhead activities    Time 12    Period Weeks    Status New    Target Date 09/09/21      PT LONG TERM GOAL #3   Title Pt will be able to lift at least 5 lbs over head to demo increased functional strength    Time 12    Period Weeks    Status New    Target Date 09/09/21      PT LONG TERM GOAL #4   Title Pt will demo at least 4/5 shoulder strength in all directions    Time 12    Period Weeks    Status New    Target Date 09/09/21      PT LONG TERM GOAL #5   Title Pt will have improved FOTO score to at least 70    Baseline 50    Time 12    Period Weeks    Status New    Target Date 09/09/21      Additional Long Term Goals   Additional Long Term Goals Yes      PT LONG TERM GOAL #6   Title **After 09/02/21** Pt will be able to reach behind her back for dressing and bathing    Baseline Limited due to shoulder precautions    Time 12    Period Weeks    Status New    Target Date 09/09/21  Plan - 07/29/21 1644     Clinical Impression Statement Pt demonstrating improved Lt shoulder AAROM with greater ease with pulleys and wall ladder.  She requires minor cues for scapular positioning with AROM/AAROM.  Pt reporting less discomfort with STM to biceps brachii and pec major.  Pt making good gains towards LTGs.    Personal Factors and Comorbidities Age;Time since onset of injury/illness/exacerbation;Profession    Examination-Activity Limitations Reach Overhead;Caring for Others;Carry;Toileting;Hygiene/Grooming;Dressing;Lift    Examination-Participation Restrictions Cleaning;Meal Prep;Occupation;Shop;Driving;Community  Activity;Laundry;Yard Work    Stability/Clinical Decision Making Stable/Uncomplicated    Rehab Potential Good    PT Frequency 2x / week    PT Duration 8 weeks    PT Treatment/Interventions ADLs/Self Care Home Management;Aquatic Therapy;Cryotherapy;Electrical Stimulation;Iontophoresis 3m/ml Dexamethasone;Moist Heat;Gait training;Stair training;Functional mobility training;Therapeutic activities;Therapeutic exercise;Balance training;Neuromuscular re-education;Manual techniques;Patient/family education;Dry needling;Passive range of motion;Scar mobilization;Taping    PT Next Visit Plan progress HEP, ROM and strength per protocol.  12 week mark on 08/05/21.  MD note.    PT Home Exercise Plan Access Code: [email or web address]   Consulted and Agree with Plan of Care Patient             Patient will benefit from skilled therapeutic intervention in order to improve the following deficits and impairments:  Decreased range of motion, Increased fascial restricitons, Increased muscle spasms, Impaired UE functional use, Decreased endurance, Decreased activity tolerance, Pain, Improper body mechanics, Decreased scar mobility, Decreased mobility, Decreased strength, Increased edema, Postural dysfunction, Hypomobility  Visit Diagnosis: Acute pain of left shoulder  Stiffness of left shoulder, not elsewhere classified  Muscle weakness (generalized)  Localized edema     Problem List Patient Active Problem List   Diagnosis Date Noted   Fracture of humerus, proximal, right, closed 02/26/2020    JKerin Perna PTA 07/29/21 5:17 PM   [place] [address]Phone: [phone]  Fax:  [phone] Name: Janet FairfaxMRN: [id number] of Birth: 603/12/1965

## 2021-08-02 ENCOUNTER — Other Ambulatory Visit: Payer: Self-pay

## 2021-08-02 ENCOUNTER — Encounter: Payer: Self-pay | Admitting: Physical Therapy

## 2021-08-02 ENCOUNTER — Ambulatory Visit: Payer: BC Managed Care – PPO | Admitting: Physical Therapy

## 2021-08-02 DIAGNOSIS — M25512 Pain in left shoulder: Secondary | ICD-10-CM | POA: Diagnosis not present

## 2021-08-02 DIAGNOSIS — R600 Localized edema: Secondary | ICD-10-CM

## 2021-08-02 DIAGNOSIS — R293 Abnormal posture: Secondary | ICD-10-CM

## 2021-08-02 DIAGNOSIS — M25612 Stiffness of left shoulder, not elsewhere classified: Secondary | ICD-10-CM

## 2021-08-02 DIAGNOSIS — M6281 Muscle weakness (generalized): Secondary | ICD-10-CM

## 2021-08-02 NOTE — Therapy (Signed)
[place], [place], [address] Phone: [phone]   Fax:  [phone]  Physical Therapy Treatment  Patient Details  Name: Janet Harper MRN: [id number] Date of Birth: [birth year] Referring Provider (PT): Ophelia Charter MD   Encounter Date: 08/02/2021   PT End of Session - 08/02/21 0807     Visit Number 12    Number of Visits 24    Date for PT Re-Evaluation 09/09/21    Authorization Type BCBS    PT Start Time 0804    PT Stop Time 0850    PT Time Calculation (min) 46 min    Activity Tolerance Patient tolerated treatment well    Behavior During Therapy [facility] for tasks assessed/performed             Past Medical History:  Diagnosis Date   Anxiety    Asthma due to seasonal allergies    COPD (chronic obstructive pulmonary disease) ([place])    Depression    Smoker    smokes 1ppd    Past Surgical History:  Procedure Laterality Date   ABLATION     REVERSE SHOULDER ARTHROPLASTY Left 05/13/2021   Procedure: REVERSE SHOULDER ARTHROPLASTY;  Surgeon: Hiram Gash, MD;  Location: [facility];  Service: Orthopedics;  Laterality: Left;   TIBIA FRACTURE SURGERY Right     There were no vitals filed for this visit.   Subjective Assessment - 08/02/21 0805     Subjective Pt reports she woke up feeling pretty good.  The weather aggrivates her (Lt) shoulder, as does using her arm to pull hair up (overhead motion)    Pertinent History Had prior R shoulder fx    Patient Stated Goals Improve shoulder movement to PLOF    Currently in Pain? Yes    Pain Score 3     Pain Location Shoulder    Pain Orientation Left    Pain Descriptors / Indicators Dull;Sore    Aggravating Factors  weather, certain motions    Pain Relieving Factors ice, OTC meds                [facility] PT Assessment - 08/02/21 0001       Assessment   Medical Diagnosis L reverse TSA    Referring Provider (PT) Ophelia Charter MD     Onset Date/Surgical Date 05/13/21    Hand Dominance Right    Next MD Visit 08/05/21    Prior Therapy R shoulder fx      AROM   Left Shoulder Flexion 130 Degrees    Left Shoulder ABduction 110 Degrees    Left Shoulder External Rotation 34 Degrees   supported scaption, in supine     PROM   Left Shoulder External Rotation 40 Degrees   supine, supported scaption              [facility] Adult PT Treatment/Exercise - 08/02/21 0001       Shoulder Exercises: Supine   Protraction Left;10 reps;AROM    External Rotation AAROM;Left;10 reps   cane, supported scaption   Flexion Both;5 reps;AAROM   cane   Diagonals AAROM;Left;5 reps   cane into scaption     Shoulder Exercises: Sidelying   ABduction AROM;Strengthening;Left;10 reps   to 90     Shoulder Exercises: Standing   Other Standing Exercises pendulum with LUE    Other Standing Exercises Wall ladder: flexion x 3 reps (able to reach 24), scaption x 3 reps (reaches 23)  Shoulder Exercises: Pulleys   Flexion 2 minutes    ABduction 2 minutes    ABduction Limitations cues to avoid hiking scapula      Shoulder Exercises: ROM/Strengthening   Rhythmic Stabilization, Supine 90 flexion, gentle perterbations x 20 sec x 3 reps      Shoulder Exercises: Isometric Strengthening   Flexion Limitations reactive isometric yellow TB x 3 sec x 10    External Rotation Limitations reactive isometric yellow TB x 3 sec x 10    ABduction Limitations reactive isometric yellow TB x 3 sec x 10      Shoulder Exercises: Stretch   Star Gazer Stretch 3 reps;30 seconds   back of palm to forehead -> hand behind head     Vasopneumatic   Number Minutes Vasopneumatic  10 minutes    Vasopnuematic Location  Shoulder   Lt   Vasopneumatic Pressure Low    Vasopneumatic Temperature  34      Manual Therapy   Soft tissue mobilization STM L biceps brachii, pec major and deltoid to decrease fascial restrictions and improve mobility    Passive ROM Lt shoulder ER and  scaption to tissue limits and no pain                       PT Short Term Goals - 08/02/21 0816       PT SHORT TERM GOAL #1   Title Pt will be independent with initial HEP    Time 4    Period Weeks    Status Achieved    Target Date 07/15/21      PT SHORT TERM GOAL #2   Title Pt will be able to perform shoulder flexion and abd AROM to at least 90 deg    Time 4    Period Weeks    Status Achieved    Target Date 07/15/21      PT SHORT TERM GOAL #3   Title Pt will report decrease in pain by at least 25%    Time 4    Period Weeks    Status Achieved    Target Date 07/15/21               PT Long Term Goals - 08/02/21 0807       PT LONG TERM GOAL #1   Title Pt will be independent with advanced HEP for strengthening    Time 12    Period Weeks    Status On-going    Target Date 09/09/21      PT LONG TERM GOAL #2   Title Pt will improve shoulder AROM to at least 140 deg in flexion and abd for overhead activities    Time 12    Period Weeks    Status On-going    Target Date 09/09/21      PT LONG TERM GOAL #3   Title Pt will be able to lift at least 5 lbs over head to demo increased functional strength    Time 12    Period Weeks    Status On-going    Target Date 09/09/21      PT LONG TERM GOAL #4   Title Pt will demo at least 4/5 shoulder strength in all directions    Time 12    Period Weeks    Status On-going    Target Date 09/09/21      PT LONG TERM GOAL #5   Title Pt will have improved FOTO score  to at least 70    Baseline 50    Time 12    Period Weeks    Status On-going    Target Date 09/09/21      PT LONG TERM GOAL #6   Title **After 08/05/21** Pt will be able to reach behind her back for dressing and bathing    Baseline Limited due to shoulder precautions    Time 12    Period Weeks    Status New    Target Date 09/09/21                   Plan - 08/02/21 0818     Clinical Impression Statement Pt is 11 wks s/p reverse Lt  TSA.  Overall, pt's pain level has reduced by 50%.  Her Lt shoulder AROM has improved each week. She is tolerating exercises well.  She has met her STGs and awaits next phase of rehab protocol.    Personal Factors and Comorbidities Age;Time since onset of injury/illness/exacerbation;Profession    Examination-Activity Limitations Reach Overhead;Caring for Others;Carry;Toileting;Hygiene/Grooming;Dressing;Lift    Examination-Participation Restrictions Cleaning;Meal Prep;Occupation;Shop;Driving;Community Activity;Laundry;Yard Work    Stability/Clinical Decision Making Stable/Uncomplicated    Rehab Potential Good    PT Frequency 2x / week    PT Duration 8 weeks    PT Treatment/Interventions ADLs/Self Care Home Management;Aquatic Therapy;Cryotherapy;Electrical Stimulation;Iontophoresis 43m/ml Dexamethasone;Moist Heat;Gait training;Stair training;Functional mobility training;Therapeutic activities;Therapeutic exercise;Balance training;Neuromuscular re-education;Manual techniques;Patient/family education;Dry needling;Passive range of motion;Scar mobilization;Taping    PT Next Visit Plan progress HEP, ROM and strength per protocol.  12 week mark on 08/05/21.    PT Home Exercise Plan Access Code: [email or web address]   Consulted and Agree with Plan of Care Patient             Patient will benefit from skilled therapeutic intervention in order to improve the following deficits and impairments:  Decreased range of motion, Increased fascial restricitons, Increased muscle spasms, Impaired UE functional use, Decreased endurance, Decreased activity tolerance, Pain, Improper body mechanics, Decreased scar mobility, Decreased mobility, Decreased strength, Increased edema, Postural dysfunction, Hypomobility  Visit Diagnosis: Acute pain of left shoulder  Stiffness of left shoulder, not elsewhere classified  Muscle weakness (generalized)  Localized edema  Abnormal posture     Problem List Patient Active Problem  List   Diagnosis Date Noted   Fracture of humerus, proximal, right, closed 02/26/2020   JKerin Perna PTA 08/02/21 8:44 AM  [place] [address]Phone: [phone]  Fax:  [phone] Name: JZerline MelchiorMRN: [id number] of Birth: 603-07-1965

## 2021-08-05 ENCOUNTER — Encounter: Payer: BC Managed Care – PPO | Admitting: Physical Therapy

## 2021-08-09 ENCOUNTER — Ambulatory Visit: Payer: BC Managed Care – PPO | Admitting: Physical Therapy

## 2021-08-12 ENCOUNTER — Other Ambulatory Visit: Payer: Self-pay

## 2021-08-12 ENCOUNTER — Ambulatory Visit: Payer: BC Managed Care – PPO | Admitting: Physical Therapy

## 2021-08-12 ENCOUNTER — Encounter: Payer: Self-pay | Admitting: Physical Therapy

## 2021-08-12 DIAGNOSIS — M25612 Stiffness of left shoulder, not elsewhere classified: Secondary | ICD-10-CM

## 2021-08-12 DIAGNOSIS — M6281 Muscle weakness (generalized): Secondary | ICD-10-CM

## 2021-08-12 DIAGNOSIS — R600 Localized edema: Secondary | ICD-10-CM

## 2021-08-12 DIAGNOSIS — M25512 Pain in left shoulder: Secondary | ICD-10-CM

## 2021-08-12 NOTE — Therapy (Signed)
[facility] [place] [address] [place], [place], [address] Phone: [phone]   Fax:  [phone]  Physical Therapy Treatment  Patient Details  Name: Janet Harper MRN: [id number] Date of Birth: 09/01/1965 Referring Provider (PT): Ramond Marrow MD   Encounter Date: 08/12/2021   PT End of Session - 08/12/21 0851     Visit Number 13    Number of Visits 24    Date for PT Re-Evaluation 09/09/21    Authorization Type BCBS    PT Start Time 0804    PT Stop Time 0849   ice pack last 10 min   PT Time Calculation (min) 45 min    Activity Tolerance Patient tolerated treatment well    Behavior During Therapy [facility] for tasks assessed/performed             Past Medical History:  Diagnosis Date   Anxiety    Asthma due to seasonal allergies    COPD (chronic obstructive pulmonary disease) ([facility])    Depression    Smoker    smokes 1ppd    Past Surgical History:  Procedure Laterality Date   ABLATION     REVERSE SHOULDER ARTHROPLASTY Left 05/13/2021   Procedure: REVERSE SHOULDER ARTHROPLASTY;  Surgeon: Bjorn Pippin, MD;  Location: [facility];  Service: Orthopedics;  Laterality: Left;   TIBIA FRACTURE SURGERY Right     There were no vitals filed for this visit.   Subjective Assessment - 08/12/21 0914     Subjective Pt reports she had follow up with surgeon.  He was pleased with progress and she no longer has any restrictions.  she states she's "been slack" on her exercises lately, but has continued work on moving her arm.    Pertinent History Had prior R shoulder fx    Patient Stated Goals Improve shoulder movement to PLOF    Currently in Pain? Yes    Pain Score 2     Pain Location Shoulder    Pain Orientation Left    Pain Descriptors / Indicators Aching    Aggravating Factors  weather, certain motions    Pain Relieving Factors OTC meds                [facility] PT Assessment - 08/12/21 0001       Assessment    Medical Diagnosis L reverse TSA    Referring Provider (PT) Ramond Marrow MD    Onset Date/Surgical Date 05/13/21    Hand Dominance Right    Next MD Visit PRN    Prior Therapy R shoulder fx              [facility] Adult PT Treatment/Exercise - 08/12/21 0001       Elbow Exercises   Elbow Flexion Both;10 reps;Seated;Bar weights/barbell   3#     Shoulder Exercises: Seated   Row Both;5 reps;Strengthening    Theraband Level (Shoulder Row) Level 1 (Yellow)      Shoulder Exercises: Sidelying   External Rotation AROM;Left;10 reps    External Rotation Limitations limited range    ABduction Strengthening;Left;10 reps   to 90   ABduction Weight (lbs) 1    Other Sidelying Exercises open book Lt rotation x 10      Shoulder Exercises: Standing   External Rotation Both;10 reps    Theraband Level (Shoulder External Rotation) Level 1 (Yellow)    Internal Rotation AAROM;Both;10 reps    Internal Rotation Limitations cane behind back, 2 versions  ABduction AAROM;Left;10 reps   cane   Extension Both;10 reps    Theraband Level (Shoulder Extension) Level 1 (Yellow)    Row Strengthening;Both;10 reps      Shoulder Exercises: Pulleys   Flexion 2 minutes    ABduction 2 minutes      Modalities   Modalities Cryotherapy      Cryotherapy   Number Minutes Cryotherapy 10 Minutes    Cryotherapy Location Shoulder    Type of Cryotherapy Ice pack                     PT Education - 08/12/21 0849     Education Details issued yellow band.  updated HEP    Person(s) Educated Patient    Methods Explanation;Verbal cues;Tactile cues;Demonstration;Handout    Comprehension Verbalized understanding;Returned demonstration              PT Short Term Goals - 08/02/21 0816       PT SHORT TERM GOAL #1   Title Pt will be independent with initial HEP    Time 4    Period Weeks    Status Achieved    Target Date 07/15/21      PT SHORT TERM GOAL #2   Title Pt will be able to perform  shoulder flexion and abd AROM to at least 90 deg    Time 4    Period Weeks    Status Achieved    Target Date 07/15/21      PT SHORT TERM GOAL #3   Title Pt will report decrease in pain by at least 25%    Time 4    Period Weeks    Status Achieved    Target Date 07/15/21               PT Long Term Goals - 08/02/21 0807       PT LONG TERM GOAL #1   Title Pt will be independent with advanced HEP for strengthening    Time 12    Period Weeks    Status On-going    Target Date 09/09/21      PT LONG TERM GOAL #2   Title Pt will improve shoulder AROM to at least 140 deg in flexion and abd for overhead activities    Time 12    Period Weeks    Status On-going    Target Date 09/09/21      PT LONG TERM GOAL #3   Title Pt will be able to lift at least 5 lbs over head to demo increased functional strength    Time 12    Period Weeks    Status On-going    Target Date 09/09/21      PT LONG TERM GOAL #4   Title Pt will demo at least 4/5 shoulder strength in all directions    Time 12    Period Weeks    Status On-going    Target Date 09/09/21      PT LONG TERM GOAL #5   Title Pt will have improved FOTO score to at least 70    Baseline 50    Time 12    Period Weeks    Status On-going    Target Date 09/09/21      PT LONG TERM GOAL #6   Title **After 08/05/21** Pt will be able to reach behind her back for dressing and bathing    Baseline Limited due to shoulder precautions    Time 12  Period Weeks    Status New    Target Date 09/09/21                   Plan - 08/12/21 0849     Clinical Impression Statement Pt has entered next phase of rehab.  Introduced shoulder ext and IR with good tolerance.  She continues with limited ER ROM and end range abdct.  Pt progressing gradually towards remaining goals.  Pt requests to drop to 1x/wk moving forward.    Personal Factors and Comorbidities Age;Time since onset of injury/illness/exacerbation;Profession     Examination-Activity Limitations Reach Overhead;Caring for Others;Carry;Toileting;Hygiene/Grooming;Dressing;Lift    Examination-Participation Restrictions Cleaning;Meal Prep;Occupation;Shop;Driving;Community Activity;Laundry;Yard Work    Stability/Clinical Decision Making Stable/Uncomplicated    Rehab Potential Good    PT Frequency 2x / week    PT Duration 8 weeks    PT Treatment/Interventions ADLs/Self Care Home Management;Aquatic Therapy;Cryotherapy;Electrical Stimulation;Iontophoresis 4mg /ml Dexamethasone;Moist Heat;Gait training;Stair training;Functional mobility training;Therapeutic activities;Therapeutic exercise;Balance training;Neuromuscular re-education;Manual techniques;Patient/family education;Dry needling;Passive range of motion;Scar mobilization;Taping    PT Next Visit Plan progress HEP, ROM and strength per protocol.    PT Home Exercise Plan Access Code: [email or web address]    Consulted and Agree with Plan of Care Patient             Patient will benefit from skilled therapeutic intervention in order to improve the following deficits and impairments:  Decreased range of motion, Increased fascial restricitons, Increased muscle spasms, Impaired UE functional use, Decreased endurance, Decreased activity tolerance, Pain, Improper body mechanics, Decreased scar mobility, Decreased mobility, Decreased strength, Increased edema, Postural dysfunction, Hypomobility  Visit Diagnosis: Acute pain of left shoulder  Stiffness of left shoulder, not elsewhere classified  Muscle weakness (generalized)  Localized edema     Problem List Patient Active Problem List   Diagnosis Date Noted   Fracture of humerus, proximal, right, closed 02/26/2020   04/27/2020, PTA 08/12/21 9:20 AM  [facility] [place] [address] [place], [place], [place] Phone: [phone]   Fax:  [phone]  Name: Janet Harper MRN: Cecille Aver Date of Birth:  1966-06-23

## 2021-08-12 NOTE — Patient Instructions (Signed)
Access Code: [email or web address] URL: [email or web address] Date: 08/12/2021 Prepared by: [place]  Exercises Seated Scapular Retraction - 1 x daily - 7 x weekly - 3 sets - 10 reps Standing Shoulder External Rotation Stretch in Doorway - 1 x daily - 7 x weekly - 3 sets - 10 sec hold Seated Shoulder Flexion Towel Slide at Table Top - 1 x daily - 7 x weekly - 2 sets - 10 reps Seated Shoulder Abduction Towel Slide at Table Top - 1 x daily - 7 x weekly - 2 sets - 10 reps Seated Shoulder External Rotation PROM on Table - 1 x daily - 7 x weekly - 2 sets - 10 reps Standing Shoulder Internal Rotation AAROM with Dowel - 2 x daily - 7 x weekly - 1 sets - 10 reps Seated Shoulder Row with Resistance Anchored at Feet - 1 x daily - 7 x weekly - 2 sets - 10 reps

## 2021-08-16 ENCOUNTER — Ambulatory Visit: Payer: BC Managed Care – PPO | Admitting: Physical Therapy

## 2021-08-19 ENCOUNTER — Ambulatory Visit: Payer: BC Managed Care – PPO | Admitting: Physical Therapy

## 2021-08-19 ENCOUNTER — Other Ambulatory Visit: Payer: Self-pay

## 2021-08-19 DIAGNOSIS — M25512 Pain in left shoulder: Secondary | ICD-10-CM

## 2021-08-19 DIAGNOSIS — R293 Abnormal posture: Secondary | ICD-10-CM

## 2021-08-19 DIAGNOSIS — M6281 Muscle weakness (generalized): Secondary | ICD-10-CM

## 2021-08-19 DIAGNOSIS — M25612 Stiffness of left shoulder, not elsewhere classified: Secondary | ICD-10-CM

## 2021-08-19 DIAGNOSIS — M25611 Stiffness of right shoulder, not elsewhere classified: Secondary | ICD-10-CM

## 2021-08-19 DIAGNOSIS — R600 Localized edema: Secondary | ICD-10-CM

## 2021-08-19 DIAGNOSIS — M25511 Pain in right shoulder: Secondary | ICD-10-CM

## 2021-08-19 NOTE — Therapy (Signed)
[facility] [address] [place], [place], [address] Phone: [phone]   Fax:  [phone]  Physical Therapy Treatment  Patient Details  Name: Janet Harper MRN: [id number] Date of Birth: 02-24-1966 Referring Provider (PT): Ramond Marrow MD   Encounter Date: 08/19/2021   PT End of Session - 08/19/21 0806     Visit Number 14    Number of Visits 24    Date for PT Re-Evaluation 09/09/21    Authorization Type [employer]    PT Start Time 0806    PT Stop Time 0845    PT Time Calculation (min) 39 min    Activity Tolerance Patient tolerated treatment well    Behavior During Therapy [facility] for tasks assessed/performed             Past Medical History:  Diagnosis Date   Anxiety    Asthma due to seasonal allergies    COPD (chronic obstructive pulmonary disease) (HCC)    Depression    Smoker    smokes 1ppd    Past Surgical History:  Procedure Laterality Date   ABLATION     REVERSE SHOULDER ARTHROPLASTY Left 05/13/2021   Procedure: REVERSE SHOULDER ARTHROPLASTY;  Surgeon: Bjorn Pippin, MD;  Location:  [facility];  Service: Orthopedics;  Laterality: Left;   TIBIA FRACTURE SURGERY Right     There were no vitals filed for this visit.   Subjective Assessment - 08/19/21 0808     Subjective Pt states she's been working on reaching behind her now.    Pertinent History Had prior R shoulder fx    Limitations Lifting;House hold activities;Writing    How long can you sit comfortably? n/a    How long can you stand comfortably? n/a    How long can you walk comfortably? n/a    Patient Stated Goals Improve shoulder movement to PLOF    Currently in Pain? Yes    Pain Score 2     Pain Location Shoulder    Pain Orientation Left    Pain Descriptors / Indicators Aching                [facility] PT Assessment - 08/19/21 0001       Assessment   Medical Diagnosis L reverse TSA    Referring Provider (PT) Ramond Marrow MD     Onset Date/Surgical Date 05/13/21    Hand Dominance Right    Next MD Visit PRN    Prior Therapy R shoulder fx                           [facility] Adult PT Treatment/Exercise - 08/19/21 0001       Shoulder Exercises: Seated   Row Both;5 reps;Strengthening    Theraband Level (Shoulder Row) Level 1 (Yellow)    External Rotation AAROM;Left;10 reps;Strengthening    External Rotation Limitations AAROM with dowel; strengthening with yellow tband x10 after    Internal Rotation AAROM;Left;20 reps    Internal Rotation Limitations with dowel    Flexion Strengthening;Left;10 reps;Theraband    Theraband Level (Shoulder Flexion) Level 1 (Yellow)    Abduction Strengthening;Left;10 reps;Theraband    Theraband Level (Shoulder ABduction) Level 1 (Yellow)    ABduction Limitations x10 eccentrics with manual assist      Shoulder Exercises: Pulleys   Flexion 2 minutes    ABduction 2 minutes      Shoulder Exercises: Stretch   Other Shoulder Stretches bicep stretch  2x30 sec    Other Shoulder Stretches forearm extensor stretch x30 sec; shoulder internal rotation stretch with towel x5      Vasopneumatic   Number Minutes Vasopneumatic  10 minutes    Vasopnuematic Location  Shoulder    Vasopneumatic Pressure Low    Vasopneumatic Temperature  34                       PT Short Term Goals - 08/02/21 0816       PT SHORT TERM GOAL #1   Title Pt will be independent with initial HEP    Time 4    Period Weeks    Status Achieved    Target Date 07/15/21      PT SHORT TERM GOAL #2   Title Pt will be able to perform shoulder flexion and abd AROM to at least 90 deg    Time 4    Period Weeks    Status Achieved    Target Date 07/15/21      PT SHORT TERM GOAL #3   Title Pt will report decrease in pain by at least 25%    Time 4    Period Weeks    Status Achieved    Target Date 07/15/21               PT Long Term Goals - 08/02/21 0807       PT LONG TERM GOAL #1    Title Pt will be independent with advanced HEP for strengthening    Time 12    Period Weeks    Status On-going    Target Date 09/09/21      PT LONG TERM GOAL #2   Title Pt will improve shoulder AROM to at least 140 deg in flexion and abd for overhead activities    Time 12    Period Weeks    Status On-going    Target Date 09/09/21      PT LONG TERM GOAL #3   Title Pt will be able to lift at least 5 lbs over head to demo increased functional strength    Time 12    Period Weeks    Status On-going    Target Date 09/09/21      PT LONG TERM GOAL #4   Title Pt will demo at least 4/5 shoulder strength in all directions    Time 12    Period Weeks    Status On-going    Target Date 09/09/21      PT LONG TERM GOAL #5   Title Pt will have improved FOTO score to at least 70    Baseline 50    Time 12    Period Weeks    Status On-going    Target Date 09/09/21      PT LONG TERM GOAL #6   Title **After 08/05/21** Pt will be able to reach behind her back for dressing and bathing    Baseline Limited due to shoulder precautions    Time 12    Period Weeks    Status New    Target Date 09/09/21                   Plan - 08/19/21 0844     Clinical Impression Statement Continued to work on strengthening with yellow tband. Provided pt with stretches for shoulder IR and biceps. Tolerated well. Remains limited with ER -- encouraged pt to focus on this.  Personal Factors and Comorbidities Age;Time since onset of injury/illness/exacerbation;Profession    Examination-Activity Limitations Reach Overhead;Caring for Others;Carry;Toileting;Hygiene/Grooming;Dressing;Lift    Examination-Participation Restrictions Cleaning;Meal Prep;Occupation;Shop;Driving;Community Activity;Laundry;Yard Work    Stability/Clinical Decision Making Stable/Uncomplicated    Rehab Potential Good    PT Frequency 2x / week    PT Duration 8 weeks    PT Treatment/Interventions ADLs/Self Care Home Management;Aquatic  Therapy;Cryotherapy;Electrical Stimulation;Iontophoresis 4mg /ml Dexamethasone;Moist Heat;Gait training;Stair training;Functional mobility training;Therapeutic activities;Therapeutic exercise;Balance training;Neuromuscular re-education;Manual techniques;Patient/family education;Dry needling;Passive range of motion;Scar mobilization;Taping    PT Next Visit Plan progress HEP, ROM and strength per protocol.    PT Home Exercise Plan Access Code: [email or web address]    Consulted and Agree with Plan of Care Patient             Patient will benefit from skilled therapeutic intervention in order to improve the following deficits and impairments:  Decreased range of motion, Increased fascial restricitons, Increased muscle spasms, Impaired UE functional use, Decreased endurance, Decreased activity tolerance, Pain, Improper body mechanics, Decreased scar mobility, Decreased mobility, Decreased strength, Increased edema, Postural dysfunction, Hypomobility  Visit Diagnosis: Acute pain of left shoulder  Stiffness of left shoulder, not elsewhere classified  Muscle weakness (generalized)  Localized edema  Abnormal posture  Acute pain of right shoulder  Stiffness of right shoulder, not elsewhere classified     Problem List Patient Active Problem List   Diagnosis Date Noted   Fracture of humerus, proximal, right, closed 02/26/2020    [facility] April Ma L Emmaleigh Longo, PT, DPT 08/19/2021, 8:46 AM  [facility] [address] [place], [place], [address] Phone: [phone]   Fax:  [phone]  Name: Janet Harper MRN: [id number] Date of Birth: [birth year]

## 2021-08-26 ENCOUNTER — Ambulatory Visit: Payer: BC Managed Care – PPO | Admitting: Physical Therapy

## 2021-09-02 ENCOUNTER — Ambulatory Visit: Payer: BC Managed Care – PPO | Admitting: Physical Therapy

## 2021-09-09 ENCOUNTER — Other Ambulatory Visit: Payer: Self-pay

## 2021-09-09 ENCOUNTER — Ambulatory Visit: Payer: BC Managed Care – PPO | Attending: Orthopaedic Surgery | Admitting: Physical Therapy

## 2021-09-09 DIAGNOSIS — M25612 Stiffness of left shoulder, not elsewhere classified: Secondary | ICD-10-CM | POA: Insufficient documentation

## 2021-09-09 DIAGNOSIS — R293 Abnormal posture: Secondary | ICD-10-CM | POA: Insufficient documentation

## 2021-09-09 DIAGNOSIS — R600 Localized edema: Secondary | ICD-10-CM | POA: Insufficient documentation

## 2021-09-09 DIAGNOSIS — M6281 Muscle weakness (generalized): Secondary | ICD-10-CM | POA: Diagnosis present

## 2021-09-09 DIAGNOSIS — M25512 Pain in left shoulder: Secondary | ICD-10-CM | POA: Insufficient documentation

## 2021-09-09 NOTE — Therapy (Addendum)
[facility] ?[place] ?[place], [place], [address] ?Phone: [phone]   Fax:  [phone] ? ?Physical Therapy Treatment and Discharge ? ?Patient Details  ?Name: Janet Harper ?MRN: [id number] ?Date of Birth: Oct 11, 1965 ?Referring Provider (PT): Ophelia Charter MD ? ?PHYSICAL THERAPY DISCHARGE SUMMARY ? ?Visits from Start of Care: 15 ? ?Current functional level related to goals / functional outcomes: ?Has met LTG 1 and 6. Pt able to reach above shoulder level; however, demos upper trap compensation above 100 deg elevation. Pt is able to reach behind her functionally for dressing.  ?  ?Remaining deficits: ?See below. Continued strength and AROM deficits against gravity.  ?  ?Education / Equipment: ?Discussed continuing PT at 1x/wk or every other week to continue progressing her strengthening.  ? ?Patient agrees to discharge. Patient goals were partially met. Patient is being discharged due to not returning since the last visit. ? ? ?Encounter Date: 09/09/2021 ? ? ? ?Past Medical History:  ?Diagnosis Date  ? Anxiety   ? Asthma due to seasonal allergies   ? COPD (chronic obstructive pulmonary disease) ([place])   ? Depression   ? Smoker   ? smokes 1ppd  ? ? ?Past Surgical History:  ?Procedure Laterality Date  ? ABLATION    ? REVERSE SHOULDER ARTHROPLASTY Left 05/13/2021  ? Procedure: REVERSE SHOULDER ARTHROPLASTY;  Surgeon: Hiram Gash, MD;  Location: [facility];  Service: Orthopedics;  Laterality: Left;  ? TIBIA FRACTURE SURGERY Right   ? ? ?There were no vitals filed for this visit. ? ? Subjective Assessment - 09/09/21 0853   ? ? Subjective Pt states she's been very busy with work. Pt reports she spends time squeezing her shoulder blades together. Has tried to keep her shoulder from getting too tense. Has only been able to perform yellow tband exercises.   ? Pertinent History Had prior R shoulder fx   ? Limitations Lifting;House hold activities;Writing    ? How long can you sit comfortably? n/a   ? How long can you stand comfortably? n/a   ? How long can you walk comfortably? n/a   ? Patient Stated Goals Improve shoulder movement to PLOF   ? Currently in Pain? No/denies   ? ?  ?  ? ?  ? ? ? ? ? [facility] PT Assessment - 09/09/21 0001   ? ?  ? Assessment  ? Medical Diagnosis L reverse TSA   ? Referring Provider (PT) Ophelia Charter MD   ? Onset Date/Surgical Date 05/13/21   ? Hand Dominance Right   ? Next MD Visit PRN   ? Prior Therapy R shoulder fx   ?  ? Observation/Other Assessments  ? Focus on Therapeutic Outcomes (FOTO)  63   ?  ? AROM  ? Right/Left Shoulder --   Performed in sitting  ? Left Shoulder Flexion 134 Degrees   ? Left Shoulder ABduction 120 Degrees   ? Left Shoulder Internal Rotation --   to sacrum behind pt's back  ? Left Shoulder External Rotation 30 Degrees   ? ?  ?  ? ?  ? ? ? ? ? ? ? ? ? ? ? ? ? ? ? ? [facility] Adult PT Treatment/Exercise - 09/09/21 0001   ? ?  ? Shoulder Exercises: Seated  ? External Rotation AROM;Strengthening;Both;10 reps;Theraband   AROM x 10; red tband x10  ? Theraband Level (Shoulder External Rotation) Level 2 (Red)   ? Internal Rotation AAROM;Left;20 reps   ?  Flexion Strengthening;Left;10 reps;Theraband;AROM   AROM x 10; red tband x10  ? Theraband Level (Shoulder Flexion) Level 2 (Red)   ? Abduction AROM;Strengthening;Left;10 reps   AROM x 10; red tband x10  ? Theraband Level (Shoulder ABduction) Level 2 (Red)   ? Other Seated Exercises Hand behind neck elbows apart x20   ?  ? Shoulder Exercises: Standing  ? External Rotation AAROM;Left;20 reps   ? External Rotation Limitations with dowel   ? Extension Both;20 reps   ? Theraband Level (Shoulder Extension) Level 3 (Green)   ? Row Strengthening;Both;20 reps   ? Theraband Level (Shoulder Row) Level 3 (Green)   ?  ? Shoulder Exercises: Pulleys  ? Flexion 1 minute   ? ABduction 1 minute   ?  ? Shoulder Exercises: ROM/Strengthening  ? UBE (Upper Arm Bike) L1, x2 min forward, 2 min backward    ? Wall Pushups 10 reps   ? ?  ?  ? ?  ? ? ? ? ? ? ? ? ? ? ? ? PT Short Term Goals - 08/02/21 0816   ? ?  ? PT SHORT TERM GOAL #1  ? Title Pt will be independent with initial HEP   ? Time 4   ? Period Weeks   ? Status Achieved   ? Target Date 07/15/21   ?  ? PT SHORT TERM GOAL #2  ? Title Pt will be able to perform shoulder flexion and abd AROM to at least 90 deg   ? Time 4   ? Period Weeks   ? Status Achieved   ? Target Date 07/15/21   ?  ? PT SHORT TERM GOAL #3  ? Title Pt will report decrease in pain by at least 25%   ? Time 4   ? Period Weeks   ? Status Achieved   ? Target Date 07/15/21   ? ?  ?  ? ?  ? ? ? ? PT Long Term Goals - 09/09/21 0909   ? ?  ? PT LONG TERM GOAL #1  ? Title Pt will be independent with advanced HEP for strengthening   ? Time 4   ? Period Weeks   ? Status Partially Met   ? Target Date 10/07/21   ?  ? PT LONG TERM GOAL #2  ? Title Pt will improve shoulder AROM to at least 140 deg in flexion and abd for overhead activities   ? Baseline 134 deg flex, 120 abd on 09/09/21   ? Time 4   ? Period Weeks   ? Status Partially Met   ? Target Date 10/07/21   ?  ? PT LONG TERM GOAL #3  ? Title Pt will be able to lift at least 5 lbs over head to demo increased functional strength   ? Time 4   ? Period Weeks   ? Status On-going   ? Target Date 10/07/21   ?  ? PT LONG TERM GOAL #4  ? Title Pt will demo at least 4/5 shoulder strength in all directions   ? Time 4   ? Period Weeks   ? Status On-going   ? Target Date 10/07/21   ?  ? PT LONG TERM GOAL #5  ? Title Pt will have improved FOTO score to at least 70   ? Baseline 63 on 09/09/21   ? Time 12   ? Period Weeks   ? Status Partially Met   ? Target Date 10/07/21   ?  ?  PT LONG TERM GOAL #6  ? Title **After 08/05/21** Pt will be able to reach behind her back for dressing and bathing   ? Baseline Limited due to shoulder precautions   ? Time 12   ? Period Weeks   ? Status Achieved   ? Target Date 09/09/21   ? ?  ?  ? ?  ? ? ? ? ? ? ? ? Plan - 09/09/21 0859   ? ?  Clinical Impression Statement Pt returns after 3 week hiatus due to work. Reassessed pt's progress towards her goals. Able to meet LTG#6; other goals partially met and ongoing. Pt's strength is still not to her goal; however, she is now able to tolerate red tband. Continued reduced AROM in shoulder ER. Discussed with pt extending for an additional month to progress her strengthening.   ? Personal Factors and Comorbidities Age;Time since onset of injury/illness/exacerbation;Profession   ? Examination-Activity Limitations Reach Overhead;Caring for Others;Carry;Toileting;Hygiene/Grooming;Dressing;Lift   ? Examination-Participation Restrictions Cleaning;Meal Prep;Occupation;Shop;Driving;Community Activity;Laundry;[employer] Work   ? Stability/Clinical Decision Making Stable/Uncomplicated   ? Rehab Potential Good   ? PT Frequency 2x / week   ? PT Duration 8 weeks   ? PT Treatment/Interventions ADLs/Self Care Home Management;Aquatic Therapy;Cryotherapy;Electrical Stimulation;Iontophoresis 39m/ml Dexamethasone;Moist Heat;Gait training;Stair training;Functional mobility training;Therapeutic activities;Therapeutic exercise;Balance training;Neuromuscular re-education;Manual techniques;Patient/family education;Dry needling;Passive range of motion;Scar mobilization;Taping   ? PT Next Visit Plan progress HEP, ROM and strength per protocol.   ? PT Home Exercise Plan Access Code: [email or web address]  ? Consulted and Agree with Plan of Care Patient   ? ?  ?  ? ?  ? ? ?Patient will benefit from skilled therapeutic intervention in order to improve the following deficits and impairments:  Decreased range of motion, Increased fascial restricitons, Increased muscle spasms, Impaired UE functional use, Decreased endurance, Decreased activity tolerance, Pain, Improper body mechanics, Decreased scar mobility, Decreased mobility, Decreased strength, Increased edema, Postural dysfunction, Hypomobility ? ?Visit Diagnosis: ?Acute pain of left  shoulder ? ?Stiffness of left shoulder, not elsewhere classified ? ?Muscle weakness (generalized) ? ?Localized edema ? ?Abnormal posture ? ? ? ? ?Problem List ?Patient Active Problem List  ? Diagnosis Date Noted  ? FPricilla Larsson

## 2021-09-13 ENCOUNTER — Encounter: Payer: BC Managed Care – PPO | Admitting: Rehabilitative and Restorative Service Providers"

## 2021-09-20 ENCOUNTER — Ambulatory Visit: Payer: BC Managed Care – PPO | Admitting: Rehabilitative and Restorative Service Providers"

## 2021-09-27 ENCOUNTER — Encounter: Payer: BC Managed Care – PPO | Admitting: Rehabilitative and Restorative Service Providers"

## 2021-10-04 ENCOUNTER — Encounter: Payer: BC Managed Care – PPO | Admitting: Physical Therapy

## 2024-02-27 ENCOUNTER — Encounter: Payer: Self-pay | Admitting: Sports Medicine
